# Patient Record
Sex: Female | Born: 1980 | Race: White | Hispanic: No | Marital: Married | Smoking: Never smoker
Health system: Southern US, Community
[De-identification: ages and names within clinical notes are randomized; demographics above are authoritative.]

## PROBLEM LIST (undated history)

## (undated) DIAGNOSIS — K589 Irritable bowel syndrome without diarrhea: Secondary | ICD-10-CM

## (undated) DIAGNOSIS — G43909 Migraine, unspecified, not intractable, without status migrainosus: Secondary | ICD-10-CM

## (undated) HISTORY — DX: Irritable bowel syndrome, unspecified: K58.9

## (undated) HISTORY — PX: OTHER SURGICAL HISTORY: SHX169

## (undated) HISTORY — DX: Migraine, unspecified, not intractable, without status migrainosus: G43.909

---

## 2004-10-19 ENCOUNTER — Ambulatory Visit: Payer: Self-pay | Admitting: Urology

## 2004-10-31 ENCOUNTER — Ambulatory Visit: Payer: Self-pay | Admitting: Urology

## 2005-05-26 ENCOUNTER — Emergency Department: Payer: Self-pay | Admitting: Emergency Medicine

## 2008-02-25 ENCOUNTER — Ambulatory Visit: Payer: Self-pay | Admitting: Internal Medicine

## 2008-04-22 ENCOUNTER — Ambulatory Visit: Payer: Self-pay | Admitting: Unknown Physician Specialty

## 2019-01-20 ENCOUNTER — Other Ambulatory Visit: Payer: Self-pay | Admitting: Unknown Physician Specialty

## 2019-01-20 DIAGNOSIS — R1310 Dysphagia, unspecified: Secondary | ICD-10-CM

## 2019-01-27 ENCOUNTER — Other Ambulatory Visit: Payer: Self-pay | Admitting: Unknown Physician Specialty

## 2019-01-27 ENCOUNTER — Ambulatory Visit
Admission: RE | Admit: 2019-01-27 | Discharge: 2019-01-27 | Disposition: A | Discharge status: Home / Self Care | Payer: Managed Care, Other (non HMO) | Source: Ambulatory Visit | Attending: Unknown Physician Specialty | Admitting: Unknown Physician Specialty

## 2019-01-27 ENCOUNTER — Other Ambulatory Visit: Payer: Self-pay

## 2019-01-27 DIAGNOSIS — R1310 Dysphagia, unspecified: Secondary | ICD-10-CM

## 2019-01-29 ENCOUNTER — Other Ambulatory Visit: Payer: Self-pay | Admitting: Unknown Physician Specialty

## 2019-01-29 DIAGNOSIS — R1011 Right upper quadrant pain: Secondary | ICD-10-CM

## 2019-02-10 ENCOUNTER — Telehealth: Payer: Self-pay | Admitting: Unknown Physician Specialty

## 2019-02-11 ENCOUNTER — Other Ambulatory Visit: Payer: Managed Care, Other (non HMO)

## 2019-02-11 ENCOUNTER — Ambulatory Visit: Payer: Managed Care, Other (non HMO)

## 2019-02-16 ENCOUNTER — Other Ambulatory Visit: Payer: Managed Care, Other (non HMO)

## 2019-02-16 ENCOUNTER — Ambulatory Visit: Payer: Managed Care, Other (non HMO)

## 2019-08-17 DIAGNOSIS — R05 Cough: Secondary | ICD-10-CM | POA: Diagnosis not present

## 2019-08-17 DIAGNOSIS — Z20828 Contact with and (suspected) exposure to other viral communicable diseases: Secondary | ICD-10-CM | POA: Diagnosis not present

## 2019-08-17 DIAGNOSIS — R0981 Nasal congestion: Secondary | ICD-10-CM | POA: Diagnosis not present

## 2019-08-31 DIAGNOSIS — Z0000 Encounter for general adult medical examination without abnormal findings: Secondary | ICD-10-CM | POA: Diagnosis not present

## 2019-09-01 ENCOUNTER — Other Ambulatory Visit: Payer: Self-pay | Admitting: Internal Medicine

## 2019-09-01 DIAGNOSIS — R1031 Right lower quadrant pain: Secondary | ICD-10-CM

## 2019-09-02 ENCOUNTER — Other Ambulatory Visit: Payer: Self-pay

## 2019-09-02 ENCOUNTER — Ambulatory Visit
Admission: RE | Admit: 2019-09-02 | Discharge: 2019-09-02 | Disposition: A | Discharge status: Home / Self Care | Payer: 59 | Source: Ambulatory Visit | Attending: Internal Medicine | Admitting: Internal Medicine

## 2019-09-02 ENCOUNTER — Other Ambulatory Visit: Payer: Self-pay | Admitting: Internal Medicine

## 2019-09-02 DIAGNOSIS — R197 Diarrhea, unspecified: Secondary | ICD-10-CM | POA: Diagnosis not present

## 2019-09-02 DIAGNOSIS — K760 Fatty (change of) liver, not elsewhere classified: Secondary | ICD-10-CM | POA: Diagnosis not present

## 2019-09-02 DIAGNOSIS — K810 Acute cholecystitis: Secondary | ICD-10-CM

## 2019-09-02 DIAGNOSIS — R1031 Right lower quadrant pain: Secondary | ICD-10-CM

## 2019-09-02 MED ORDER — IOHEXOL 300 MG/ML  SOLN
100.0000 mL | Freq: Once | INTRAMUSCULAR | Status: AC | PRN
  Administered 2019-09-02: 100 mL via INTRAVENOUS

## 2019-09-06 DIAGNOSIS — R1084 Generalized abdominal pain: Secondary | ICD-10-CM | POA: Diagnosis not present

## 2019-09-06 DIAGNOSIS — D72829 Elevated white blood cell count, unspecified: Secondary | ICD-10-CM | POA: Diagnosis not present

## 2019-09-07 DIAGNOSIS — R1084 Generalized abdominal pain: Secondary | ICD-10-CM | POA: Diagnosis not present

## 2019-09-16 DIAGNOSIS — R946 Abnormal results of thyroid function studies: Secondary | ICD-10-CM | POA: Diagnosis not present

## 2019-09-16 DIAGNOSIS — R1084 Generalized abdominal pain: Secondary | ICD-10-CM | POA: Diagnosis not present

## 2019-09-16 DIAGNOSIS — R197 Diarrhea, unspecified: Secondary | ICD-10-CM | POA: Diagnosis not present

## 2019-09-16 DIAGNOSIS — G4700 Insomnia, unspecified: Secondary | ICD-10-CM | POA: Diagnosis not present

## 2019-09-17 DIAGNOSIS — R197 Diarrhea, unspecified: Secondary | ICD-10-CM | POA: Diagnosis not present

## 2019-09-17 DIAGNOSIS — R1084 Generalized abdominal pain: Secondary | ICD-10-CM | POA: Diagnosis not present

## 2019-10-14 DIAGNOSIS — G44229 Chronic tension-type headache, not intractable: Secondary | ICD-10-CM | POA: Diagnosis not present

## 2019-10-14 DIAGNOSIS — R634 Abnormal weight loss: Secondary | ICD-10-CM | POA: Diagnosis not present

## 2019-10-14 DIAGNOSIS — K580 Irritable bowel syndrome with diarrhea: Secondary | ICD-10-CM | POA: Diagnosis not present

## 2019-10-27 DIAGNOSIS — R300 Dysuria: Secondary | ICD-10-CM | POA: Diagnosis not present

## 2019-11-05 DIAGNOSIS — R109 Unspecified abdominal pain: Secondary | ICD-10-CM | POA: Diagnosis not present

## 2019-11-05 DIAGNOSIS — K589 Irritable bowel syndrome without diarrhea: Secondary | ICD-10-CM | POA: Diagnosis not present

## 2019-11-05 DIAGNOSIS — R319 Hematuria, unspecified: Secondary | ICD-10-CM | POA: Diagnosis not present

## 2019-11-05 DIAGNOSIS — R1011 Right upper quadrant pain: Secondary | ICD-10-CM | POA: Diagnosis not present

## 2019-11-10 DIAGNOSIS — R1084 Generalized abdominal pain: Secondary | ICD-10-CM | POA: Diagnosis not present

## 2019-11-10 DIAGNOSIS — R69 Illness, unspecified: Secondary | ICD-10-CM | POA: Diagnosis not present

## 2019-11-24 ENCOUNTER — Ambulatory Visit: Payer: 59 | Attending: Internal Medicine

## 2019-11-24 DIAGNOSIS — Z20828 Contact with and (suspected) exposure to other viral communicable diseases: Secondary | ICD-10-CM | POA: Diagnosis not present

## 2019-11-24 DIAGNOSIS — Z20822 Contact with and (suspected) exposure to covid-19: Secondary | ICD-10-CM

## 2019-11-25 LAB — NOVEL CORONAVIRUS, NAA: SARS-CoV-2, NAA: NOT DETECTED

## 2019-12-29 ENCOUNTER — Encounter: Payer: Self-pay | Admitting: Urology

## 2019-12-29 ENCOUNTER — Ambulatory Visit: Payer: 59 | Admitting: Urology

## 2019-12-29 ENCOUNTER — Ambulatory Visit: Payer: Self-pay | Admitting: Urology

## 2019-12-29 ENCOUNTER — Other Ambulatory Visit: Payer: Self-pay

## 2019-12-29 VITALS — BP 126/86 | HR 90 | Ht 68.0 in | Wt 212.0 lb

## 2019-12-29 DIAGNOSIS — N390 Urinary tract infection, site not specified: Secondary | ICD-10-CM

## 2019-12-29 LAB — BLADDER SCAN AMB NON-IMAGING: Scan Result: 55

## 2019-12-30 LAB — MICROSCOPIC EXAMINATION: RBC, Urine: NONE SEEN /hpf (ref 0–2)

## 2019-12-30 LAB — URINALYSIS, COMPLETE
Bilirubin, UA: NEGATIVE
Glucose, UA: NEGATIVE
Leukocytes,UA: NEGATIVE
Nitrite, UA: NEGATIVE
RBC, UA: NEGATIVE
Specific Gravity, UA: 1.025 (ref 1.005–1.030)
Urobilinogen, Ur: 0.2 mg/dL (ref 0.2–1.0)
pH, UA: 5.5 (ref 5.0–7.5)

## 2019-12-30 NOTE — Progress Notes (Signed)
12/29/2019 8:02 AM   [name] [birth year] [id number]  Referring provider: Danella Penton, MD [phone] [facility] [address] [facility] [place],  [place] [address]  Chief Complaint  Patient presents with  . Recurrent UTI    HPI: Latasha Graham is a 39 y.o. female seen in consultation at request of Dr. Hyacinth Meeker for evaluation of recurrent UTIs.  She complains recurrent UTI, yeast infections and kidney pain.  She had a urinalysis and early December 2020 which showed pyuria and was associated with symptoms of vaginal itching, dysuria, urinary frequency and malodorous urine.  Urine was positive for E. coli and she was treated with a course of cefuroxime with improvement in her symptoms.  She was seen at [facility] on 11/05/2019 with complaints of right upper quadrant pain which has been intermittent since October 2020.  She CT of the abdomen/pelvis with contrast and right upper quadrant ultrasound which showed no significant abnormalities.  CT did not show genitourinary abnormalities.  Follow-up urinalysis and culture were negative.  She was hospitalized in [place] several years ago for pyelonephritis which initially presented with nausea, vomiting, fatigue and flulike symptoms.  She states her case was severe as there was a delay in diagnosis.  She has a history of stone disease with previous ureteroscopic removal/stent x2.  Her last stone episode was around 1996/1997.  She has intermittent urinary symptoms of dysuria, frequency, intermittent stream and urinary hesitancy.  No relation of her symptoms to intercourse.  She estimates the symptoms occurring 4 times in the last 6 months.  She has bilateral mid back pain.  She has had 3 cultures since March 2019 one being positive however states she often treats with hydration and supplements as she does not like to take antibiotics unless absolutely necessary.  She also complains of recurrent yeast infections.  She denies  gross hematuria and on occasion notes a small amount of blood on tissue paper when wiping however is not sure if this is vaginal or rectal.  Her urinalysis have not shown microhematuria.   PMH: No past medical history on file.  Surgical History: Procedure Laterality Date  . CERVICAL BIOPSY W/ LOOP ELECTRODE EXCISION  . COLONOSCOPY 01/06/2019  Adenomatous Polyp: CBF 12/2023  . EGD 01/07/2019  Gastritis: No repeat per RTE  . EGD 02/09/2019  Gastritis: Minimal chronic: No repeat per RTE  . Kidney stone removal 1994    Home Medications:  Allergies as of 12/29/2019      Reactions   Levaquin [levofloxacin] Other (See Comments)   Pt states it makes her Lethargic      Medication List       Accurate as of December 29, 2019 11:59 PM. If you have any questions, ask your nurse or doctor.        ALPRAZolam 0.25 MG tablet Commonly known as: XANAX Take 0.25 mg by mouth 2 (two) times daily.   azelastine 0.1 % nasal spray Commonly known as: ASTELIN Place into the nose.   chlorpheniramine-HYDROcodone 10-8 MG/5ML Suer Commonly known as: TUSSIONEX Take by mouth.   clonazePAM 0.5 MG tablet Commonly known as: KLONOPIN TAKE 1 TABLET (0.5 MG TOTAL) BY MOUTH 2 (TWO) TIMES DAILY FOR 30 DAYS   cyanocobalamin 1000 MCG tablet Take by mouth.   dicyclomine 20 MG tablet Commonly known as: BENTYL Take by mouth.   eletriptan 40 MG tablet Commonly known as: RELPAX TAKE 1 TAB DAILY AS NEEDED FOR HEADACHE MAY TAKE A 2ND DOSE AFTER 2 HR IF  NEEDED   imipramine 10 MG tablet Commonly known as: TOFRANIL Take 10 mg by mouth at bedtime.   omeprazole 40 MG capsule Commonly known as: PRILOSEC Take 40 mg by mouth daily.   ondansetron 8 MG tablet Commonly known as: ZOFRAN Take 8 mg by mouth 2 (two) times daily.   pramipexole 0.25 MG tablet Commonly known as: MIRAPEX TAKE 1 TABLET BY MOUTH NIGHTLY   topiramate 25 MG tablet Commonly known as: TOPAMAX Take 100 mg by mouth at bedtime.     venlafaxine XR 37.5 MG 24 hr capsule Commonly known as: EFFEXOR-XR Take 37.5 mg by mouth daily.       Allergies:  Allergies  Allergen Reactions  . Levaquin [Levofloxacin] Other (See Comments)    Pt states it makes her Lethargic     Family History: No family history on file.  Social History:  reports that she has never smoked. She has never used smokeless tobacco. She reports that she does not drink alcohol or use drugs.  ROS: UROLOGY Frequent Urination?: No Hard to postpone urination?: No Burning/pain with urination?: Yes Get up at night to urinate?: Yes Leakage of urine?: Yes Urine stream starts and stops?: Yes Trouble starting stream?: No Do you have to strain to urinate?: Yes Blood in urine?: No Urinary tract infection?: Yes Sexually transmitted disease?: No Injury to kidneys or bladder?: No Painful intercourse?: No Weak stream?: Yes Currently pregnant?: No Vaginal bleeding?: No Last menstrual period?: n  Gastrointestinal Nausea?: Yes Vomiting?: No Indigestion/heartburn?: Yes Diarrhea?: Yes Constipation?: Yes  Constitutional Fever: No Night sweats?: No Weight loss?: Yes Fatigue?: Yes  Skin Skin rash/lesions?: No Itching?: No  Eyes Blurred vision?: No Double vision?: No  Ears/Nose/Throat Sore throat?: No Sinus problems?: Yes  Hematologic/Lymphatic Swollen glands?: No Easy bruising?: Yes  Cardiovascular Leg swelling?: No Chest pain?: No  Respiratory Cough?: Yes Shortness of breath?: No  Endocrine Excessive thirst?: No  Musculoskeletal Back pain?: Yes Joint pain?: Yes  Neurological Headaches?: Yes Dizziness?: Yes  Psychologic Depression?: No Anxiety?: Yes  Physical Exam: BP 126/86   Pulse 90   Ht 5\' 8"  (1.727 m)   Wt 212 lb (96.2 kg)   BMI 32.23 kg/m   Constitutional:  Alert and oriented, No acute distress. HEENT: [place] AT, moist mucus membranes.  Trachea midline, no masses. Cardiovascular: No clubbing, cyanosis, or  edema. Respiratory: Normal respiratory effort, no increased work of breathing. GI: Abdomen is soft, nontender, nondistended, no abdominal masses GU: No CVA tenderness Skin: No rashes, bruises or suspicious lesions. Neurologic: Grossly intact, no focal deficits, moving all 4 extremities. Psychiatric: Normal mood and affect.  Laboratory Data: Urinalysis Dipstick/microscopy negative  Pertinent Imaging: CT 08/2019 personally reviewed  Assessment & Plan:   - 39 y.o. female with recurrent lower urinary tract symptoms including dysuria.  1 documented positive urine culture.  We discussed preventative treatment with supplements including cranberry tablets and D-mannose.  We will have her follow-up in approximately 4-6 weeks for recheck of symptoms.  Discussed the role of cystoscopy and evaluation of recurrent UTI and voiding symptoms.  She has not had microhematuria.  We will discuss further on follow-up.  -Recent CT showed no urinary calculi or changes suspicious for pyelonephritis  -I did recommend a gynecology evaluation for her recurrent yeast infections.   Abbie Sons, [place] [address], [place], [place] [address] [phone]

## 2020-01-02 ENCOUNTER — Encounter: Payer: Self-pay | Admitting: Urology

## 2020-01-27 ENCOUNTER — Ambulatory Visit: Payer: Self-pay | Admitting: Urology

## 2020-08-02 IMAGING — CT CT ABD-PELV W/ CM
2 of 4 series · 16 of 46 positions shown, 18 images · IV contrast (omnipaque)
Comparison: Noncontrast CT on 02/25/2008 from [HOSPITAL]

CLINICAL DATA: Right lower quadrant pain, fever, and diarrhea for 5
days.

EXAM:
CT ABDOMEN AND PELVIS WITH CONTRAST
TECHNIQUE: Multidetector CT imaging of the abdomen and pelvis was performed
using the standard protocol following bolus administration of
intravenous contrast.
CONTRAST:  100mL OMNIPAQUE IOHEXOL 300 MG/ML  SOLN

[Series 2: abd pelvis · axial · 0.75mm/px · z∈[-1692,-1217]mm · 13 of 105 slices shown, 15 images]
[im 5/105  soft-tissue]
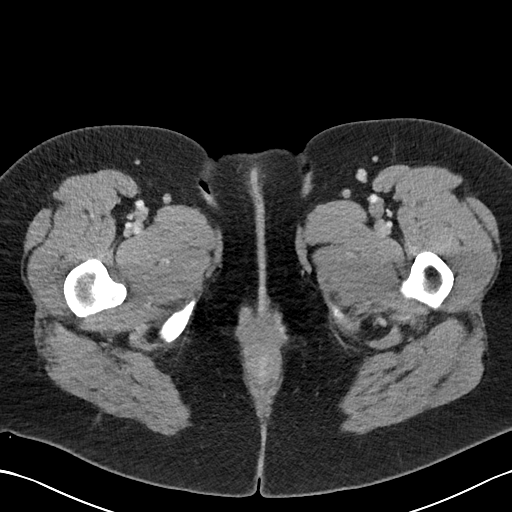
[im 5/105  bone]
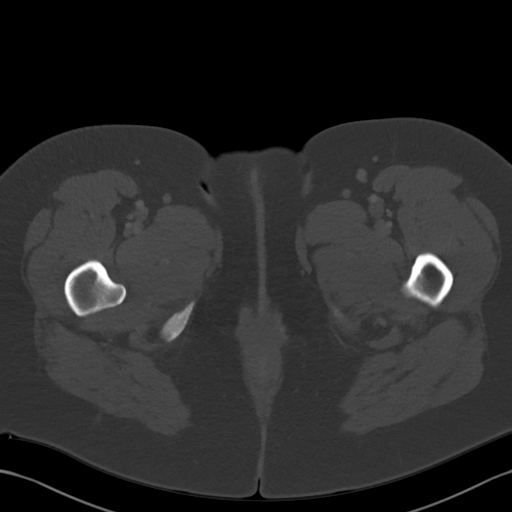
[im 14/105  soft-tissue]
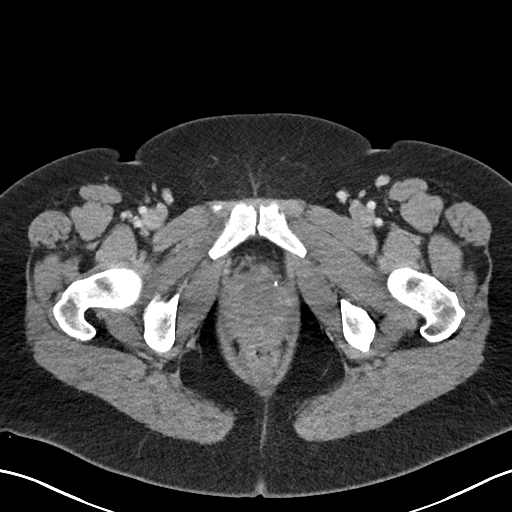
[im 23/105  soft-tissue]
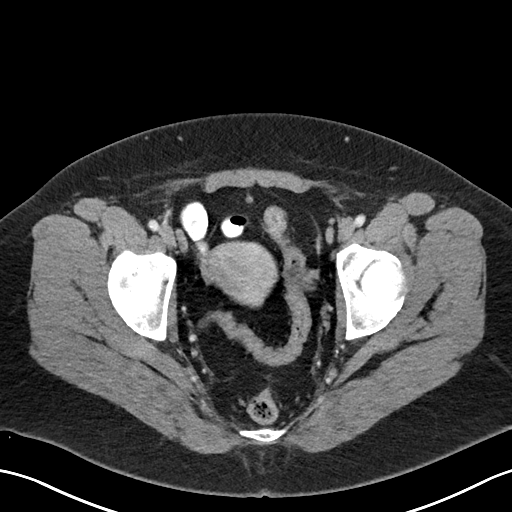
[im 28/105  soft-tissue]
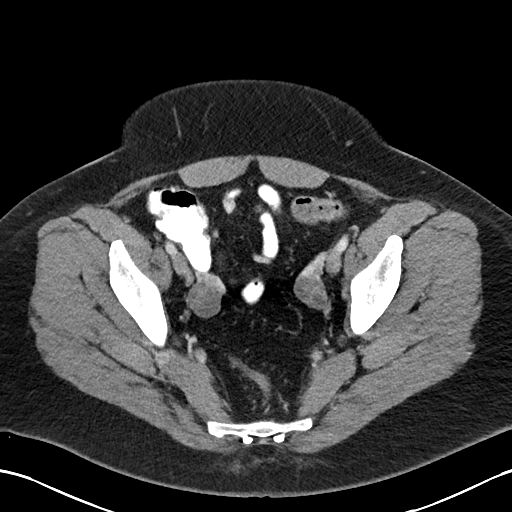
[im 37/105  soft-tissue]
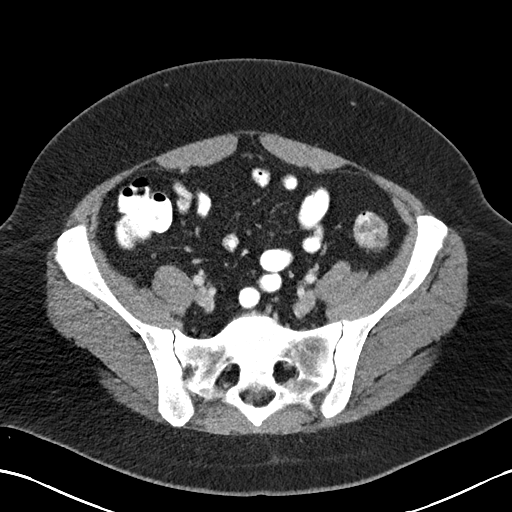
[im 46/105  soft-tissue]
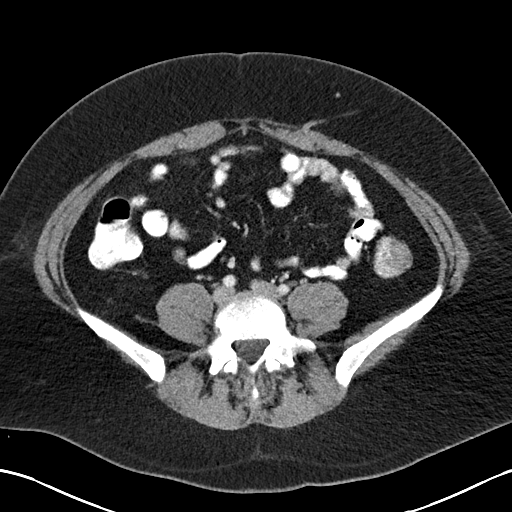
[im 55/105  soft-tissue]
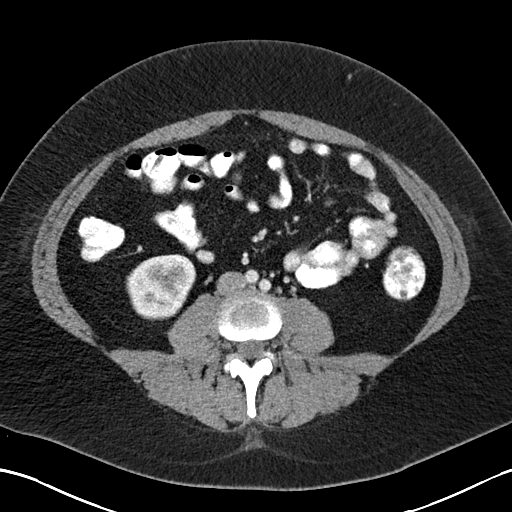
[im 59/105  soft-tissue]
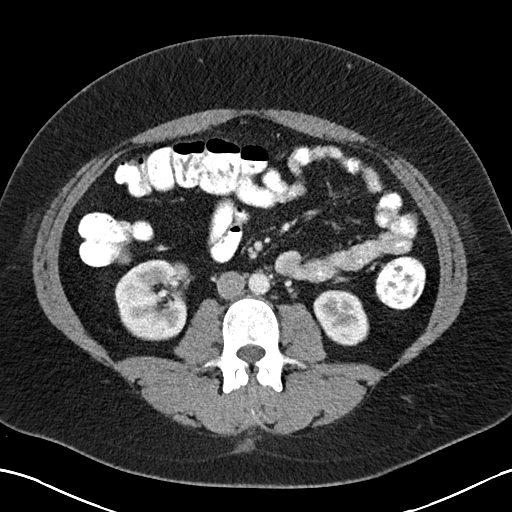
[im 68/105  soft-tissue]
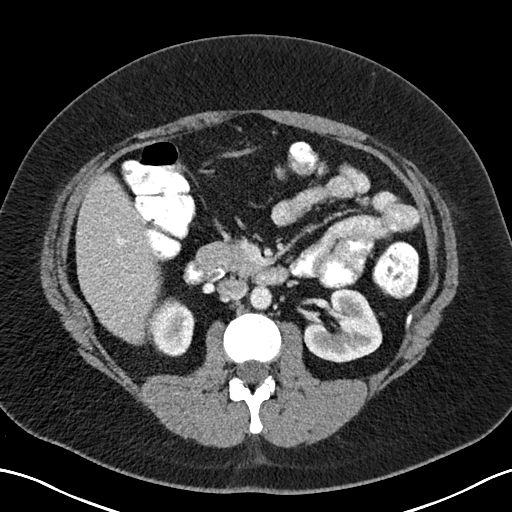
[im 68/105  bone]
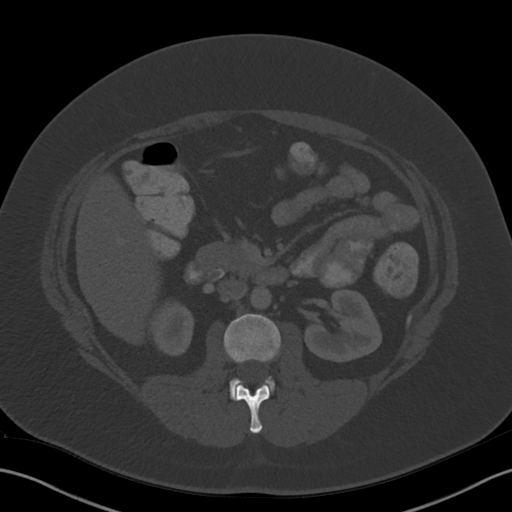
[im 77/105  soft-tissue]
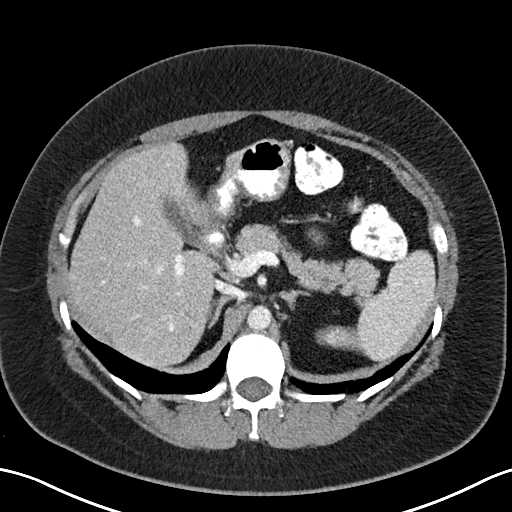
[im 82/105  soft-tissue]
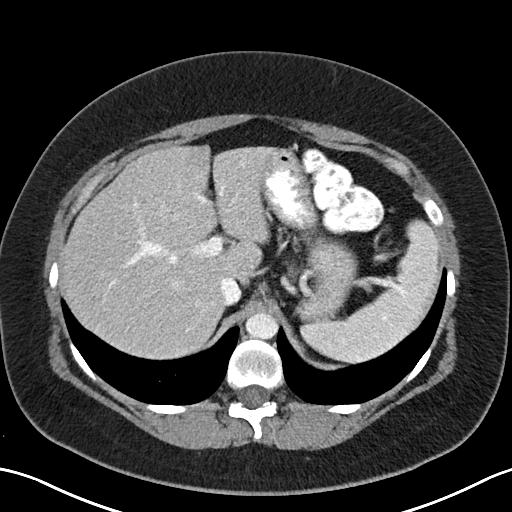
[im 91/105  soft-tissue]
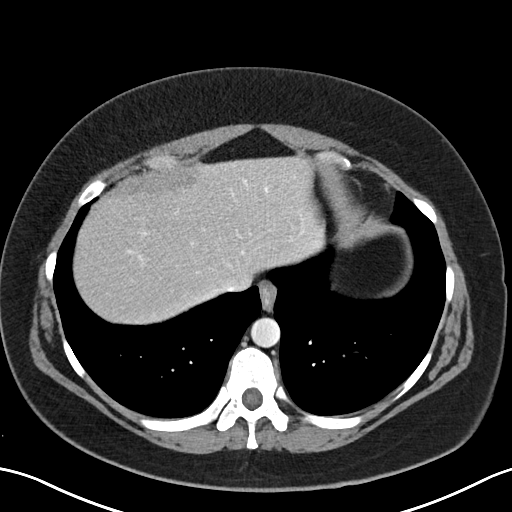
[im 100/105  soft-tissue]
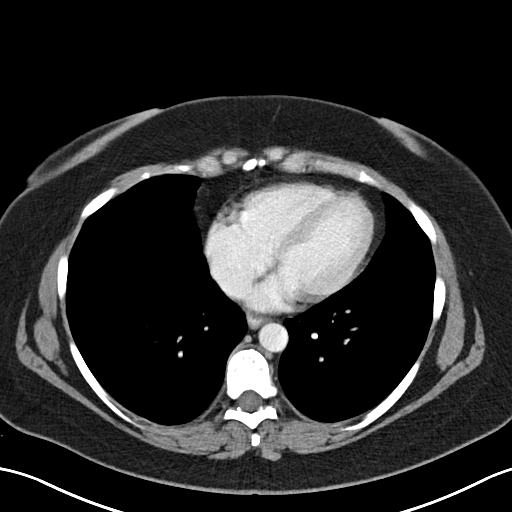

[Series 4: coronals abd pelvis · coronal · 0.75mm/px · 3 of 176 slices shown]
[im 59/176  soft-tissue]
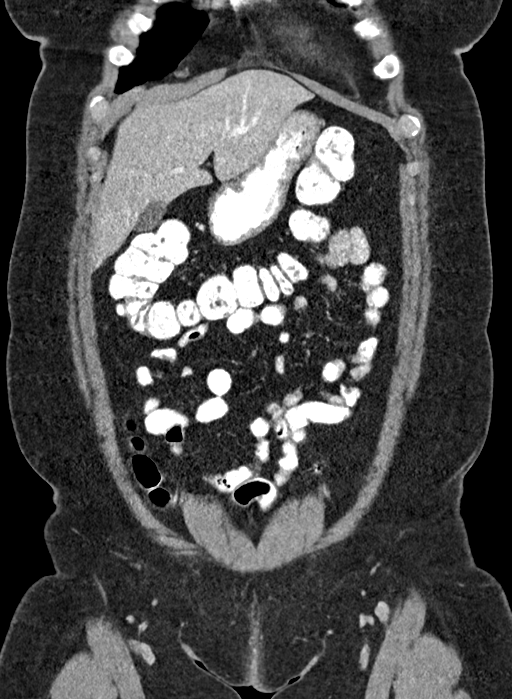
[im 78/176  soft-tissue]
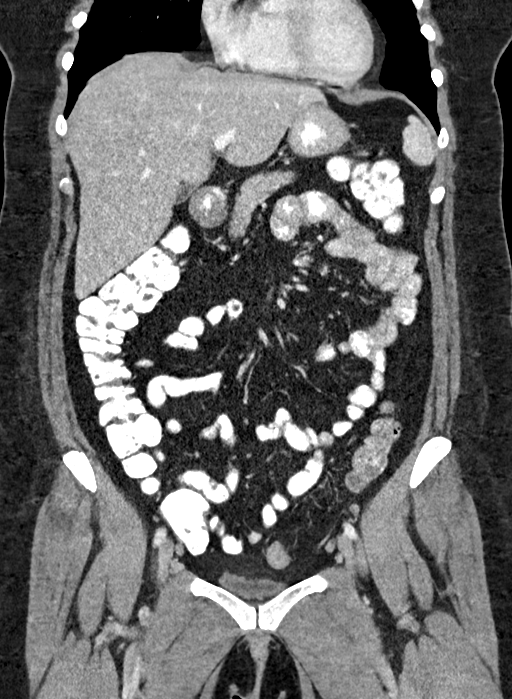
[im 98/176  soft-tissue]
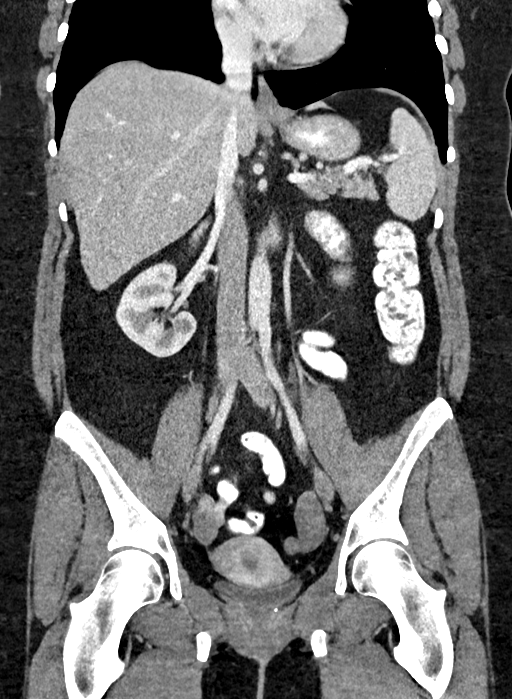

[16 of 46 positions shown; findings below may reference images not displayed]

FINDINGS: Lower Chest: No acute findings.

Hepatobiliary: No hepatic masses identified. Gallbladder is
unremarkable. No evidence of biliary ductal dilatation.

Pancreas:  No mass or inflammatory changes.

Spleen: Within normal limits in size and appearance.

Adrenals/Urinary Tract: No masses identified. Tiny right renal cyst
noted. No evidence of hydronephrosis.

Stomach/Bowel: No evidence of obstruction, inflammatory process or
abnormal fluid collections. Normal appendix visualized.

Vascular/Lymphatic: No pathologically enlarged lymph nodes. No
abdominal aortic aneurysm.

Reproductive:  No mass or other significant abnormality.

Other:  No evidence of inguinal hernia or mass.

Musculoskeletal:  No suspicious bone lesions identified.
IMPRESSION: Negative. No evidence of appendicitis or other significant
abnormality.

## 2020-08-02 IMAGING — US US ABDOMEN LIMITED
1 series · 14 of 25 positions shown · non-contrast
Comparison: None.

CLINICAL DATA: Patient with right-sided abdominal pain.

EXAM:
ULTRASOUND ABDOMEN LIMITED RIGHT UPPER QUADRANT

[Series 1: us abdomen limited · 0.26mm/px · 14 of 44 slices shown]
[im 1/44]
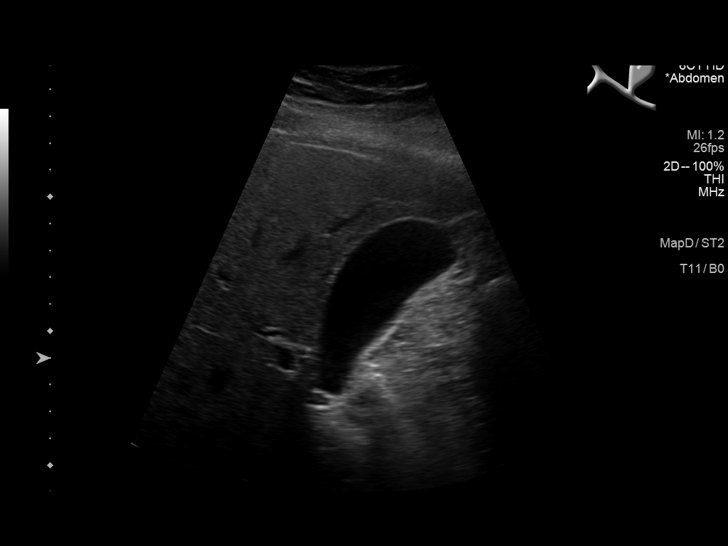
[im 4/44]
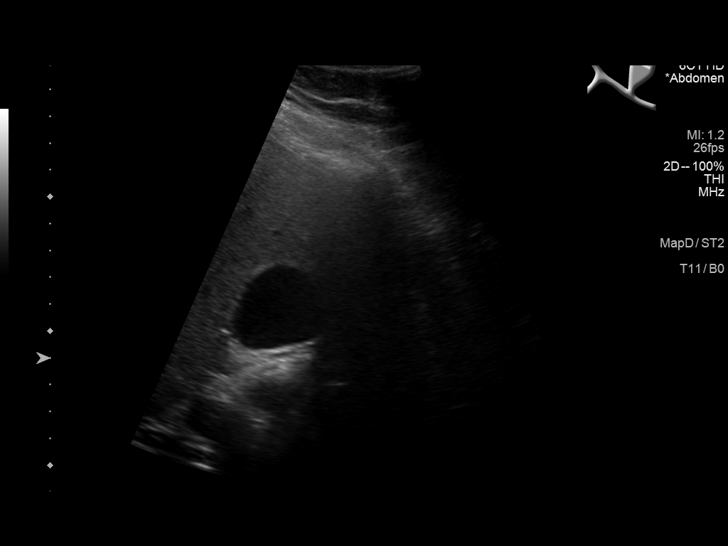
[im 8/44]
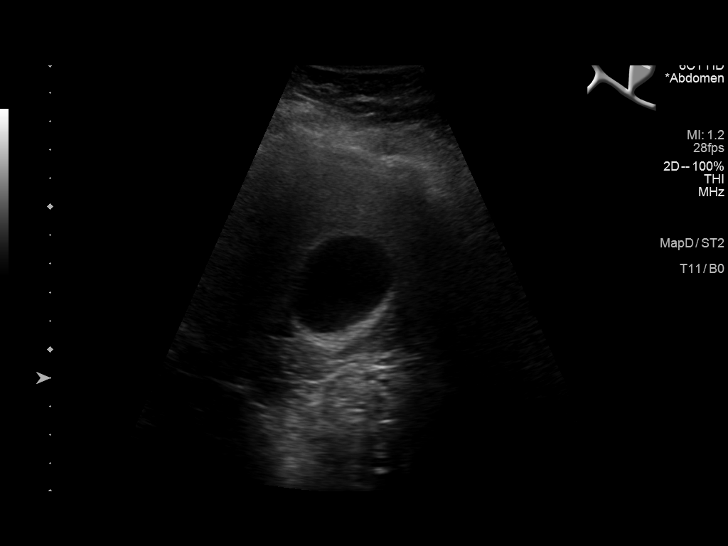
[im 11/44]
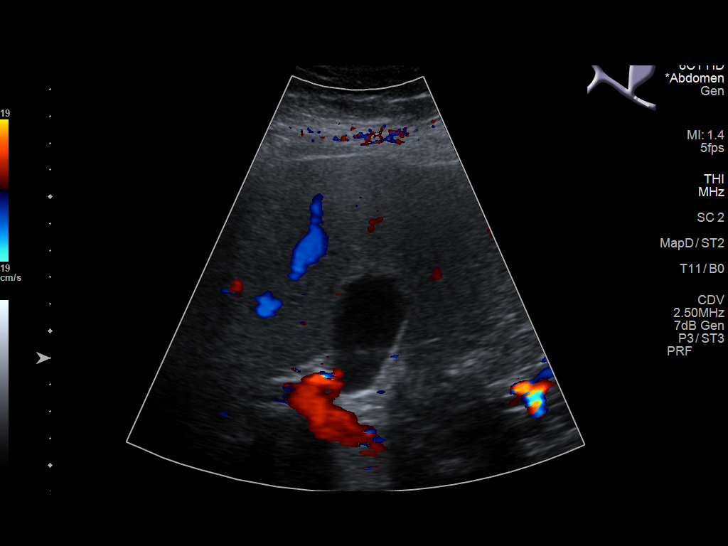
[im 15/44]
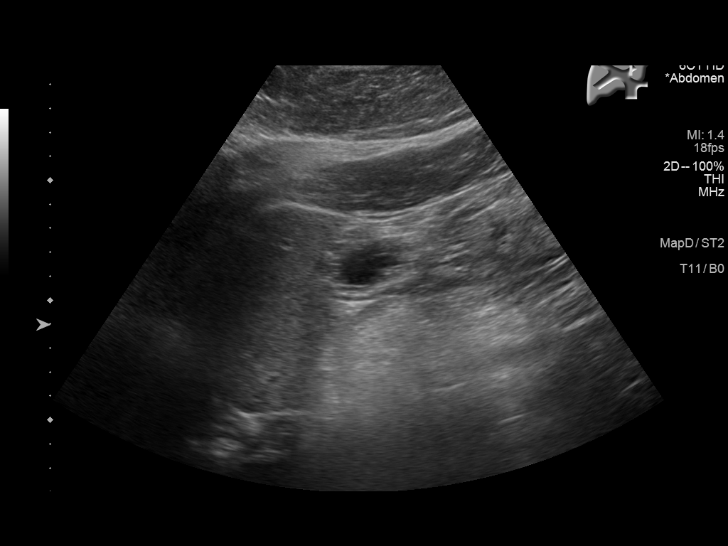
[im 17/44]
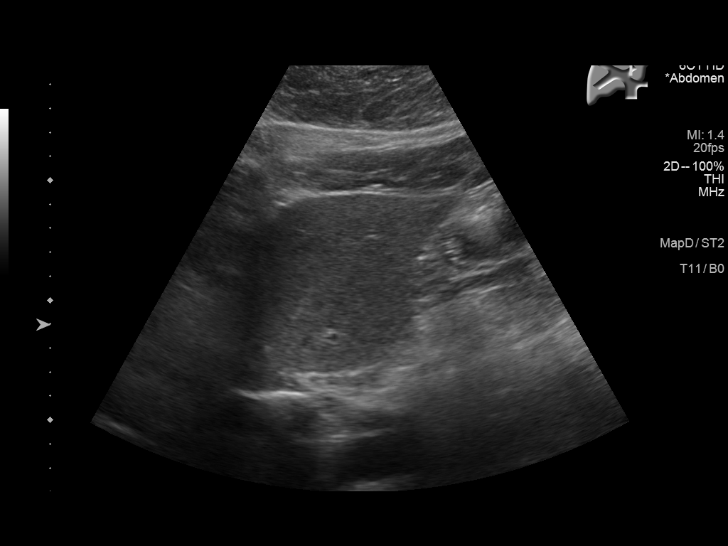
[im 20/44]
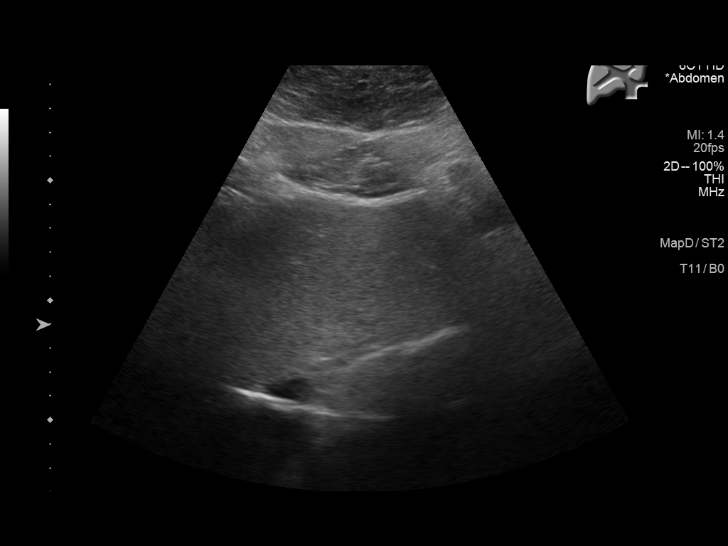
[im 24/44]
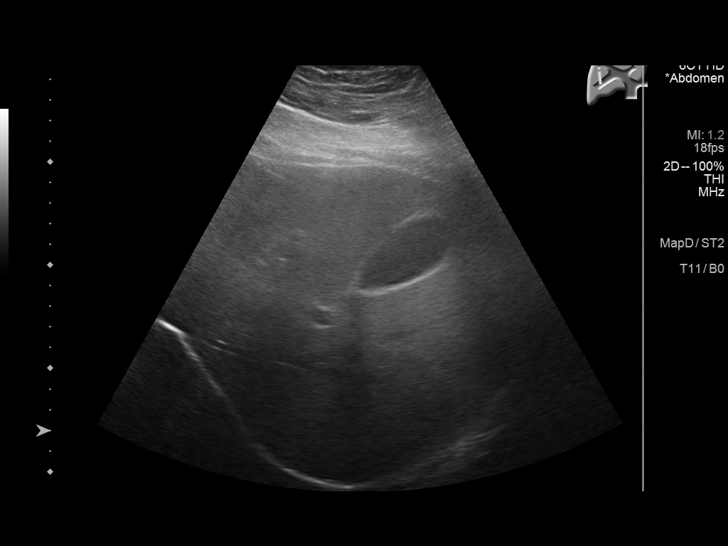
[im 27/44]
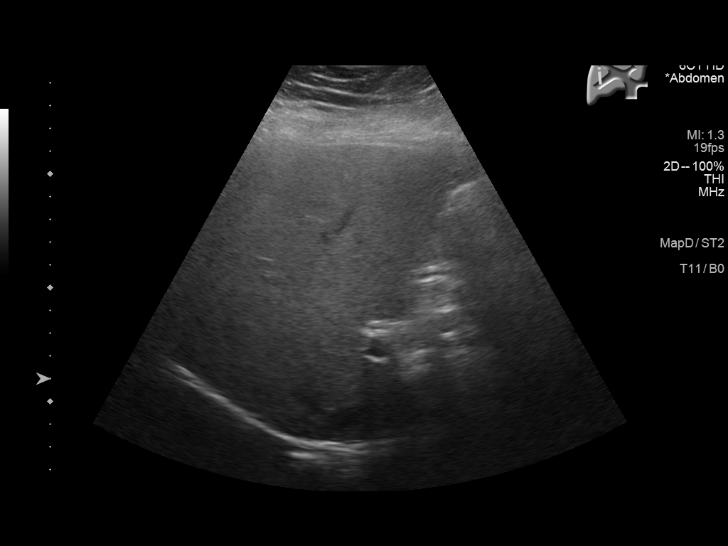
[im 29/44]
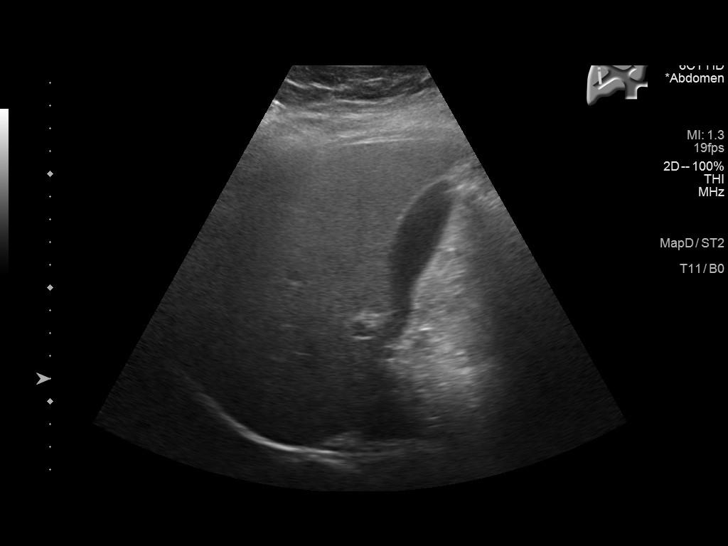
[im 33/44]
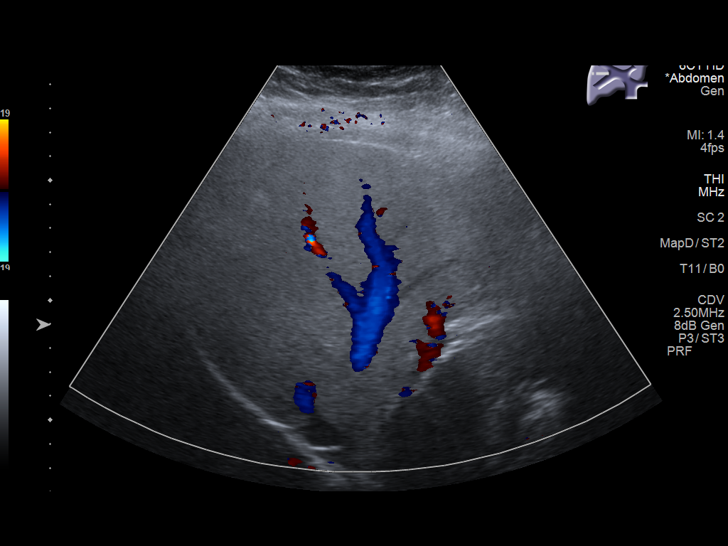
[im 36/44]
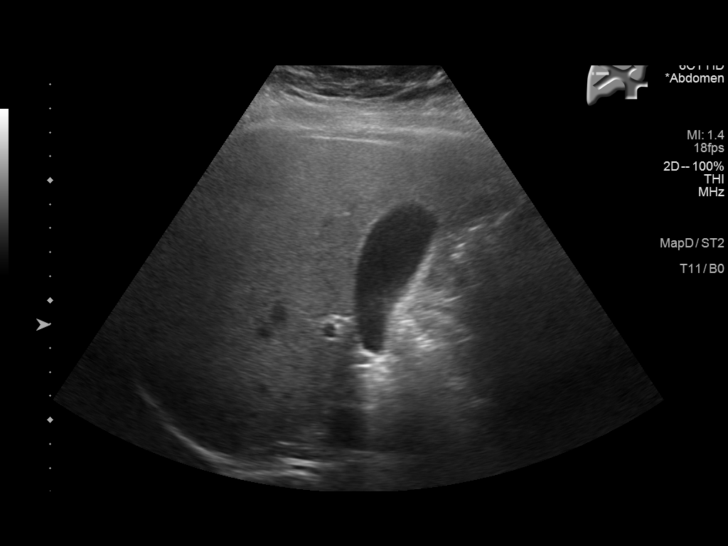
[im 40/44]
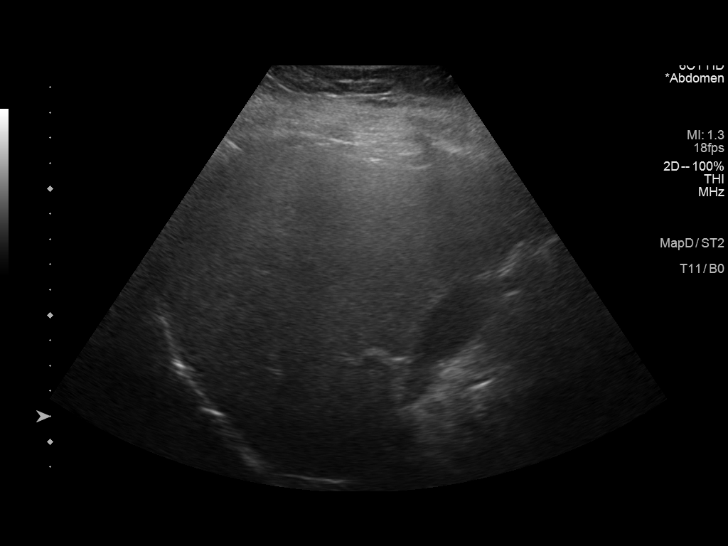
[im 44/44]
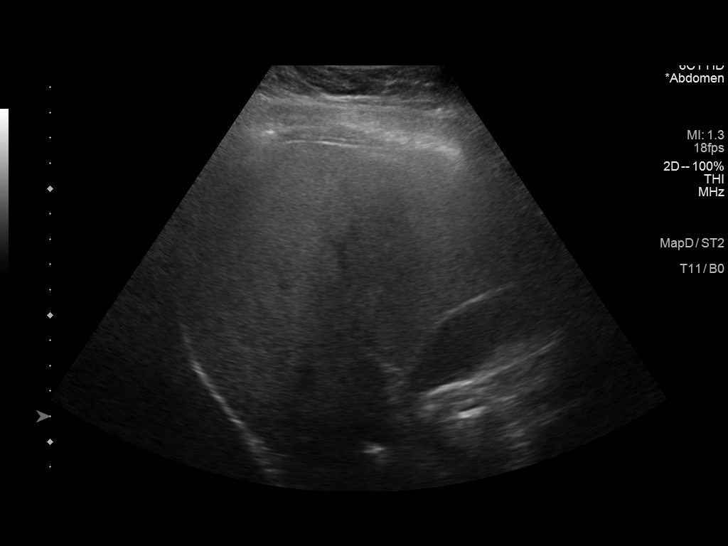

[14 of 25 positions shown; findings below may reference images not displayed]

FINDINGS: Gallbladder:

No gallstones or wall thickening visualized. No sonographic Murphy
sign noted by sonographer.

Common bile duct:

Diameter: 3 mm

Liver:

Increased in echogenicity. No focal hepatic lesions identified.
Portal vein is patent on color Doppler imaging with normal direction
of blood flow towards the liver.

Other: None.
IMPRESSION: No cholelithiasis or sonographic evidence for acute cholecystitis.

Hepatic steatosis.

## 2021-06-28 ENCOUNTER — Ambulatory Visit: Payer: Self-pay | Admitting: Obstetrics and Gynecology

## 2021-07-26 ENCOUNTER — Ambulatory Visit: Payer: Self-pay | Admitting: Obstetrics and Gynecology

## 2021-08-23 ENCOUNTER — Ambulatory Visit: Payer: Self-pay | Admitting: Obstetrics and Gynecology

## 2021-09-13 ENCOUNTER — Ambulatory Visit: Payer: Self-pay | Admitting: Obstetrics and Gynecology

## 2021-09-25 ENCOUNTER — Ambulatory Visit: Payer: Self-pay | Admitting: Obstetrics and Gynecology

## 2021-10-05 ENCOUNTER — Ambulatory Visit (INDEPENDENT_AMBULATORY_CARE_PROVIDER_SITE_OTHER): Payer: Self-pay | Admitting: Obstetrics and Gynecology

## 2021-10-05 ENCOUNTER — Encounter: Payer: Self-pay | Admitting: Obstetrics and Gynecology

## 2021-10-05 ENCOUNTER — Other Ambulatory Visit (HOSPITAL_COMMUNITY)
Admission: RE | Admit: 2021-10-05 | Discharge: 2021-10-05 | Disposition: A | Discharge status: Home / Self Care | Payer: Commercial Managed Care - PPO | Source: Ambulatory Visit | Attending: Obstetrics and Gynecology | Admitting: Obstetrics and Gynecology

## 2021-10-05 ENCOUNTER — Other Ambulatory Visit: Payer: Self-pay

## 2021-10-05 VITALS — BP 122/70 | Ht 68.0 in | Wt 219.0 lb

## 2021-10-05 DIAGNOSIS — Z124 Encounter for screening for malignant neoplasm of cervix: Secondary | ICD-10-CM

## 2021-10-05 DIAGNOSIS — N39498 Other specified urinary incontinence: Secondary | ICD-10-CM

## 2021-10-05 DIAGNOSIS — K649 Unspecified hemorrhoids: Secondary | ICD-10-CM

## 2021-10-05 DIAGNOSIS — Z1331 Encounter for screening for depression: Secondary | ICD-10-CM

## 2021-10-05 DIAGNOSIS — Z01419 Encounter for gynecological examination (general) (routine) without abnormal findings: Secondary | ICD-10-CM | POA: Insufficient documentation

## 2021-10-05 DIAGNOSIS — Z1339 Encounter for screening examination for other mental health and behavioral disorders: Secondary | ICD-10-CM

## 2021-10-05 DIAGNOSIS — N390 Urinary tract infection, site not specified: Secondary | ICD-10-CM

## 2021-10-05 NOTE — Progress Notes (Signed)
Gynecology Annual Exam  PCP: Danella Penton, MD  Chief Complaint  Patient presents with   Gynecologic Exam    Hemorrhoids, spotting between cycles (last 4 months), leaks urine when coughs   History of Present Illness:  [name] is a 40 y.o. G0P0000 who LMP was Patient's last menstrual period was 09/21/2021 (approximate)., presents today for her annual examination.  Her menses are regular every 28-30 days, lasting 7 day(s).  Dysmenorrhea severe, occurring premenstrually and first 1-2 days of flow. She does have intermenstrual bleeding.  She also has spotting that sometimes just stops and sometimes leads into her period, though this can be not on time for her period.  She feels like this mostly occurs closer to the time of her period.  She believes that this has been going on for the past 3-4 months.  She denies bleeding after intercourse.  With her discharge, she believes she has an odor.  This has been going on for about 6 months. This occurs all the time.  She notes some itching associated with this.    She does not have vasomotor sx. She states that she stays pretty warm all the time. She doesn't think she has hot flashes.   She is sexually active. She does not have vaginal dryness. If anything, she would say she has excessive discharge and maybe an odor.  She was on NuvaRing for a while and she stopped this when she found out her husband could not get her pregnant.   Last Pap: ?2 years ago  Results were: no abnormalities /neg HPV DNA.  Hx of STDs: none History of LEEP procedure.  This was back in college.   Last mammogram: never had.  There is a  FH of breast cancer. There is a FH of ovarian cancer. The patient does not do self-breast exams.  Colonoscopy: has had. She has IBS.  She has seen Dr. Markham Jordan.   DEXA: has not been screened for osteoporosis  Tobacco use:  former smoker. Alcohol use: social drinker Exercise: walks her dog.  This is like walking/hiking  The patient  wears seatbelts: yes.     She reports leaking urine and a current UTI.  Sometimes the leaking of urine can happen with stress (laughing, coughing, etc), other times she just leaks urine. In her career she would hold her urine for a while.    She also has a history of pyelonephritis. She has been hospitalized for this (back in 2001).  She has a distant history of kidney stones.  She feels like when she gets stressed out she has manifestations in her kidneys. Currently, her right kidney is bothering her. She is on day 4 of amoxicillin.  She was on cefatin prior to this.  She was told to stop this medication due to the fact that it didn't seem to be working.    She also believes that she is getting a yeast infection due to taking antibiotics.   She had COVID back in August. She is taking some long-term medications which make it hard for her to focus.   She has a hemorrhoid hat she would like to have assessed.  Perhaps by GI vs surgery.    Past Medical History:  Diagnosis Date   IBS (irritable bowel syndrome)    Migraines    Past Surgical History:  Procedure Laterality Date   OTHER SURGICAL HISTORY     Kidney Stone removed    Prior to Admission medications  Medication Sig Start Date End Date Taking? Authorizing Provider  ALPRAZolam (XANAX) 0.25 MG tablet Take 0.25 mg by mouth 2 (two) times daily. 11/15/19  Yes [provider]  amoxicillin-clavulanate (AUGMENTIN) 500-125 MG tablet Take 1 tablet by mouth 3 (three) times daily. 10/01/21  Yes [provider]  amphetamine-dextroamphetamine (ADDERALL) 10 MG tablet Take 10 mg by mouth 2 (two) times daily. 09/17/21  Yes [provider]  B-D INTEGRA SYRINGE 22G X 1-1/2" 3 ML MISC 3 CC SYRINGE WITH 25 GAUGE 1 [facility] NEEDLE 04/20/21  Yes [provider]  cyanocobalamin (,VITAMIN B-12,) 1000 MCG/ML injection Inject into the muscle. 08/02/21 10/31/21 Yes [provider]  Cyanocobalamin (VITAMIN B-12 PO) Take by  mouth.   Yes [provider]  cyclobenzaprine (FLEXERIL) 10 MG tablet TAKE 1 TABLET BY MOUTH TWICE A DAY AS NEEDED FOR MUSCLE SPASMS FOR UP TO 10 DAYS 03/08/21  Yes [provider]  eletriptan (RELPAX) 40 MG tablet TAKE 1 TAB DAILY AS NEEDED FOR HEADACHE MAY TAKE A 2ND DOSE AFTER 2 HR IF NEEDED 12/21/19  Yes [provider]  famotidine (PEPCID) 40 MG tablet Take 40 mg by mouth at bedtime. 09/20/21  Yes [provider]  omeprazole (PRILOSEC) 40 MG capsule Take 40 mg by mouth daily. 12/19/19  Yes [provider]  ondansetron (ZOFRAN) 8 MG tablet Take 8 mg by mouth 2 (two) times daily. 12/07/19  Yes [provider]  pramipexole (MIRAPEX) 0.5 MG tablet Take 0.5 mg by mouth 2 (two) times daily. 09/21/21  Yes [provider]  propranolol (INDERAL) 40 MG tablet Take 40 mg by mouth 2 (two) times daily. 10/03/21  Yes [provider]  temazepam (RESTORIL) 30 MG capsule Take 30 mg by mouth at bedtime as needed. 08/01/21  Yes [provider]  topiramate (TOPAMAX) 100 MG tablet Take 100 mg by mouth at bedtime. 10/01/21  Yes [provider]  traZODone (DESYREL) 50 MG tablet Take 50 mg by mouth at bedtime. 09/21/21  Yes [provider]  triamcinolone (KENALOG) 0.025 % ointment Apply 1 application topically 2 (two) times daily. 08/30/21  Yes [provider]  venlafaxine XR (EFFEXOR-XR) 75 MG 24 hr capsule Take 75 mg by mouth daily. 05/21/21  Yes [provider]    Allergies  Allergen Reactions   Levaquin [Levofloxacin] Other (See Comments)    Pt states it makes her Lethargic     Obstetric History: G0P0000  Family History  Problem Relation Age of Onset   Ovarian cancer Maternal Aunt 34   Cancer Maternal Aunt        Mouth    Social History   Socioeconomic History   Marital status: Married    Spouse name: Not on file   Number of children: Not on file   Years of education: Not on file    Highest education level: Not on file  Occupational History   Not on file  Tobacco Use   Smoking status: Never   Smokeless tobacco: Never  Vaping Use   Vaping Use: Never used  Substance and Sexual Activity   Alcohol use: Never   Drug use: Never   Sexual activity: Yes    Birth control/protection: None  Other Topics Concern   Not on file  Social History Narrative   Not on file   Social Determinants of Health   Financial Resource Strain: Not on file  Food Insecurity: Not on file  Transportation Needs: Not on file  Physical Activity: Not on  file  Stress: Not on file  Social Connections: Not on file  Intimate Partner Violence: Not on file   Review of Systems  Constitutional: Negative.   HENT: Negative.    Eyes: Negative.   Respiratory: Negative.    Cardiovascular: Negative.   Gastrointestinal: Negative.   Genitourinary: Negative.   Musculoskeletal: Negative.   Skin: Negative.   Neurological: Negative.   Psychiatric/Behavioral: Negative.      Physical Exam BP 122/70   Ht 5\' 8"  (1.727 m)   Wt 219 lb (99.3 kg)   LMP 09/21/2021 (Approximate)   BMI 33.30 kg/m   Physical Exam Constitutional:      General: She is not in acute distress.    Appearance: Normal appearance. She is well-developed.  Genitourinary:     Vulva and bladder normal.     Right Labia: No rash, tenderness, lesions, skin changes or Bartholin's cyst.    Left Labia: No tenderness, lesions, skin changes, Bartholin's cyst or rash.    No inguinal adenopathy present in the right or left side.    Pelvic Tanner Score: 5/5.    No vaginal discharge, erythema, tenderness or bleeding.      Right Adnexa: not tender, not full and no mass present.    Left Adnexa: not tender, not full and no mass present.    No cervical motion tenderness, discharge, lesion or polyp.     Uterus is not enlarged or tender.     No uterine mass detected.    Pelvic exam was performed with patient in the lithotomy position.  Breasts:     Right: No inverted nipple, mass, nipple discharge, skin change or tenderness.     Left: No inverted nipple, mass, nipple discharge, skin change or tenderness.  HENT:     Head: Normocephalic and atraumatic.  Eyes:     General: No scleral icterus.    Conjunctiva/sclera: Conjunctivae normal.  Neck:     Thyroid: No thyromegaly.  Cardiovascular:     Rate and Rhythm: Normal rate and regular rhythm.     Heart sounds: No murmur heard.   No friction rub. No gallop.  Pulmonary:     Effort: Pulmonary effort is normal. No respiratory distress.     Breath sounds: Normal breath sounds. No wheezing or rales.  Abdominal:     General: Bowel sounds are normal. There is no distension.     Palpations: Abdomen is soft. There is no mass.     Tenderness: There is no abdominal tenderness. There is no guarding or rebound.     Hernia: There is no hernia in the left inguinal area or right inguinal area.  Musculoskeletal:        General: No swelling or tenderness. Normal range of motion.     Cervical back: Normal range of motion and neck supple.  Lymphadenopathy:     Cervical: No cervical adenopathy.     Lower Body: No right inguinal adenopathy. No left inguinal adenopathy.  Neurological:     General: No focal deficit present.     Mental Status: She is alert and oriented to person, place, and time.     Cranial Nerves: No cranial nerve deficit.  Skin:    General: Skin is warm and dry.     Findings: No erythema or rash.  Psychiatric:        Mood and Affect: Mood normal.        Behavior: Behavior normal.        Judgment: Judgment normal.  Female chaperone present for pelvic and breast  portions of the physical exam  Results: AUDIT Questionnaire (screen for alcoholism): 3 PHQ-9: 8  Assessment: 40 y.o. G0P0000 female here for routine gynecologic examination.  Plan: Problem List Items Addressed This Visit   None Visit Diagnoses     Women's annual routine gynecological examination    -  Primary    Relevant Orders   Cytology - PAP   Screening for depression       Screening for alcoholism       Pap smear for cervical cancer screening       Relevant Orders   Cytology - PAP   Other urinary incontinence       Relevant Orders   Ambulatory referral to Urogynecology   Hemorrhoids, unspecified hemorrhoid type       Relevant Medications   propranolol (INDERAL) 40 MG tablet   Other Relevant Orders   Ambulatory referral to Gastroenterology   Recurrent UTI       Relevant Orders   Ambulatory referral to Urogynecology       Screening: -- Blood pressure screen normal -- Colonoscopy -  per GI -- Mammogram - due. Patient to call [facility] to arrange. She understands that it is her responsibility to arrange this. -- Weight screening: obese: discussed management options, including lifestyle, dietary, and exercise. -- Depression screening negative (PHQ-9) -- Nutrition: normal -- cholesterol screening: not due for screening -- osteoporosis screening: not due -- tobacco screening: not using -- alcohol screening: AUDIT questionnaire indicates low-risk usage. -- family history of breast cancer screening: done. not at high risk. -- no evidence of domestic violence or intimate partner violence. -- STD screening: gonorrhea/chlamydia NAAT not collected per patient request. -- pap smear collected per ASCCP guidelines  Incontinence: We will refer to urogynecology for further work-up.  Based on her description, she could have a component of stress urinary incontinence and possibly overflow urinary incontinence, with her long history of deferring voiding.  The urogynecologist could also address her recurrent urinary tract infection history, if she so desires.  Hemorrhoid: GI referral  Thomasene Mohair, MD 10/05/2021 3:43 PM

## 2021-10-06 ENCOUNTER — Encounter: Payer: Self-pay | Admitting: Obstetrics and Gynecology

## 2021-10-10 LAB — CYTOLOGY - PAP
Comment: NEGATIVE
Diagnosis: NEGATIVE
High risk HPV: NEGATIVE
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
## 2021-10-16 ENCOUNTER — Encounter: Payer: Self-pay | Admitting: Obstetrics and Gynecology

## 2021-10-16 ENCOUNTER — Ambulatory Visit (INDEPENDENT_AMBULATORY_CARE_PROVIDER_SITE_OTHER): Payer: Commercial Managed Care - PPO | Admitting: Obstetrics and Gynecology

## 2021-10-16 ENCOUNTER — Other Ambulatory Visit: Payer: Self-pay

## 2021-10-16 VITALS — BP 146/90 | HR 86 | Ht 68.0 in | Wt 219.0 lb

## 2021-10-16 DIAGNOSIS — N390 Urinary tract infection, site not specified: Secondary | ICD-10-CM

## 2021-10-16 DIAGNOSIS — N3941 Urge incontinence: Secondary | ICD-10-CM | POA: Diagnosis not present

## 2021-10-16 DIAGNOSIS — R350 Frequency of micturition: Secondary | ICD-10-CM | POA: Diagnosis not present

## 2021-10-16 DIAGNOSIS — N393 Stress incontinence (female) (male): Secondary | ICD-10-CM | POA: Diagnosis not present

## 2021-10-16 LAB — POCT URINALYSIS DIPSTICK
Appearance: ABNORMAL
Bilirubin, UA: NEGATIVE
Glucose, UA: NEGATIVE
Ketones, UA: NEGATIVE
Leukocytes, UA: NEGATIVE
Nitrite, UA: NEGATIVE
Protein, UA: POSITIVE — AB
Spec Grav, UA: >=1.03 — AB (ref 1.010–1.025)
Urobilinogen, UA: 0.2 E.U./dL
pH, UA: 5.5 (ref 5.0–8.0)

## 2021-10-16 NOTE — Patient Instructions (Signed)
For treatment of recurrent urinary tract infections, you can use D-mannose, and cranberry supplements over the counter.   Get urine cultures each time you have symptoms.   A referral has been placed to pelvic floor PT.

## 2021-11-01 ENCOUNTER — Ambulatory Visit: Payer: Commercial Managed Care - PPO | Admitting: Physical Therapy

## 2021-11-08 ENCOUNTER — Ambulatory Visit: Payer: Commercial Managed Care - PPO | Admitting: Physical Therapy

## 2021-11-15 ENCOUNTER — Ambulatory Visit: Payer: Commercial Managed Care - PPO | Admitting: Physical Therapy

## 2021-11-22 ENCOUNTER — Ambulatory Visit: Payer: Commercial Managed Care - PPO | Attending: Obstetrics and Gynecology | Admitting: Physical Therapy

## 2021-11-29 ENCOUNTER — Ambulatory Visit: Payer: Commercial Managed Care - PPO | Admitting: Physical Therapy

## 2021-12-06 ENCOUNTER — Encounter: Payer: Commercial Managed Care - PPO | Admitting: Physical Therapy

## 2021-12-13 ENCOUNTER — Encounter: Payer: Commercial Managed Care - PPO | Admitting: Physical Therapy

## 2021-12-20 ENCOUNTER — Encounter: Payer: Commercial Managed Care - PPO | Admitting: Physical Therapy

## 2022-01-18 ENCOUNTER — Ambulatory Visit: Payer: Commercial Managed Care - PPO | Admitting: Obstetrics and Gynecology

## 2022-01-23 ENCOUNTER — Ambulatory Visit: Payer: Commercial Managed Care - PPO | Admitting: Obstetrics and Gynecology

## 2022-09-16 ENCOUNTER — Encounter: Payer: Self-pay | Admitting: *Deleted

## 2024-04-27 ENCOUNTER — Encounter
Admission: RE | Disposition: A | Discharge status: Home / Self Care | Payer: Self-pay | Source: Home / Self Care | Attending: Surgery

## 2024-04-27 ENCOUNTER — Other Ambulatory Visit: Payer: Self-pay

## 2024-04-27 ENCOUNTER — Ambulatory Visit: Admitting: Anesthesiology

## 2024-04-27 ENCOUNTER — Ambulatory Visit: Payer: Self-pay | Admitting: Surgery

## 2024-04-27 ENCOUNTER — Encounter: Payer: Self-pay | Admitting: Surgery

## 2024-04-27 ENCOUNTER — Ambulatory Visit
Admission: RE | Admit: 2024-04-27 | Discharge: 2024-04-27 | Disposition: A | Discharge status: Home / Self Care | Attending: Surgery | Admitting: Surgery

## 2024-04-27 DIAGNOSIS — K645 Perianal venous thrombosis: Secondary | ICD-10-CM | POA: Insufficient documentation

## 2024-04-27 DIAGNOSIS — K219 Gastro-esophageal reflux disease without esophagitis: Secondary | ICD-10-CM | POA: Insufficient documentation

## 2024-04-27 DIAGNOSIS — K6282 Dysplasia of anus: Secondary | ICD-10-CM | POA: Diagnosis not present

## 2024-04-27 DIAGNOSIS — K641 Second degree hemorrhoids: Secondary | ICD-10-CM | POA: Insufficient documentation

## 2024-04-27 DIAGNOSIS — Z87891 Personal history of nicotine dependence: Secondary | ICD-10-CM | POA: Insufficient documentation

## 2024-04-27 DIAGNOSIS — Z860100 Personal history of colon polyps, unspecified: Secondary | ICD-10-CM | POA: Diagnosis not present

## 2024-04-27 DIAGNOSIS — L918 Other hypertrophic disorders of the skin: Secondary | ICD-10-CM | POA: Insufficient documentation

## 2024-04-27 DIAGNOSIS — Z01812 Encounter for preprocedural laboratory examination: Secondary | ICD-10-CM

## 2024-04-27 HISTORY — PX: RECTAL EXAM UNDER ANESTHESIA: SHX6399

## 2024-04-27 HISTORY — PX: HEMORRHOID SURGERY: SHX153

## 2024-04-27 LAB — POCT PREGNANCY, URINE: Preg Test, Ur: NEGATIVE

## 2024-04-27 SURGERY — HEMORRHOIDECTOMY
Anesthesia: General | Site: Rectum

## 2024-04-27 MED ORDER — FENTANYL CITRATE (PF) 100 MCG/2ML IJ SOLN
INTRAMUSCULAR | Status: DC | PRN
Start: 2024-04-27 — End: 2024-04-27
  Administered 2024-04-27: 50 ug via INTRAVENOUS

## 2024-04-27 MED ORDER — SEVOFLURANE IN SOLN
RESPIRATORY_TRACT | Status: AC
  Filled 2024-04-27: qty 250

## 2024-04-27 MED ORDER — OXYCODONE HCL 5 MG PO TABS
ORAL_TABLET | ORAL | Status: AC
  Filled 2024-04-27: qty 1

## 2024-04-27 MED ORDER — CHLORHEXIDINE GLUCONATE 0.12 % MT SOLN
15.0000 mL | Freq: Once | OROMUCOSAL | Status: AC
Start: 2024-04-27 — End: 2024-04-27

## 2024-04-27 MED ORDER — CHLORHEXIDINE GLUCONATE CLOTH 2 % EX PADS: 6.0000 | MEDICATED_PAD | Freq: Once | CUTANEOUS | Status: DC

## 2024-04-27 MED ORDER — BUPIVACAINE-EPINEPHRINE (PF) 0.5% -1:200000 IJ SOLN
INTRAMUSCULAR | Status: AC
  Filled 2024-04-27: qty 30

## 2024-04-27 MED ORDER — PROPOFOL 10 MG/ML IV BOLUS
INTRAVENOUS | Status: DC | PRN
  Administered 2024-04-27: 50 mg via INTRAVENOUS
  Administered 2024-04-27: 150 mg via INTRAVENOUS
  Administered 2024-04-27: 50 mg via INTRAVENOUS

## 2024-04-27 MED ORDER — CHLORHEXIDINE GLUCONATE 0.12 % MT SOLN
OROMUCOSAL | Status: AC
  Filled 2024-04-27: qty 15

## 2024-04-27 MED ORDER — OXYCODONE HCL 5 MG/5ML PO SOLN: 5.0000 mg | Freq: Once | ORAL | Status: AC | PRN

## 2024-04-27 MED ORDER — ACETAMINOPHEN 325 MG PO TABS: 650.0000 mg | ORAL_TABLET | Freq: Three times a day (TID) | ORAL | 0 refills | Status: AC | PRN

## 2024-04-27 MED ORDER — GELATIN ABSORBABLE 12-7 MM EX MISC
CUTANEOUS | Status: AC
  Filled 2024-04-27: qty 1

## 2024-04-27 MED ORDER — PROPOFOL 10 MG/ML IV BOLUS
INTRAVENOUS | Status: AC
  Filled 2024-04-27: qty 20

## 2024-04-27 MED ORDER — CEFAZOLIN SODIUM-DEXTROSE 2-4 GM/100ML-% IV SOLN
2.0000 g | INTRAVENOUS | Status: AC
  Administered 2024-04-27: 2 g via INTRAVENOUS

## 2024-04-27 MED ORDER — MIDAZOLAM HCL 2 MG/2ML IJ SOLN
INTRAMUSCULAR | Status: AC
  Filled 2024-04-27: qty 2

## 2024-04-27 MED ORDER — GELATIN ABSORBABLE 12-7 MM EX MISC
CUTANEOUS | Status: DC | PRN
Start: 2024-04-27 — End: 2024-04-27
  Administered 2024-04-27: 1

## 2024-04-27 MED ORDER — OXYCODONE HCL 5 MG PO TABS
5.0000 mg | ORAL_TABLET | Freq: Once | ORAL | Status: AC | PRN
  Administered 2024-04-27: 5 mg via ORAL

## 2024-04-27 MED ORDER — ONDANSETRON HCL 4 MG/2ML IJ SOLN
INTRAMUSCULAR | Status: DC | PRN
  Administered 2024-04-27: 4 mg via INTRAVENOUS

## 2024-04-27 MED ORDER — MIDAZOLAM HCL 2 MG/2ML IJ SOLN
INTRAMUSCULAR | Status: DC | PRN
  Administered 2024-04-27: 2 mg via INTRAVENOUS

## 2024-04-27 MED ORDER — LACTATED RINGERS IV SOLN: INTRAVENOUS | Status: DC

## 2024-04-27 MED ORDER — LIDOCAINE HCL (CARDIAC) PF 100 MG/5ML IV SOSY
PREFILLED_SYRINGE | INTRAVENOUS | Status: DC | PRN
  Administered 2024-04-27: 100 mg via INTRAVENOUS

## 2024-04-27 MED ORDER — DEXAMETHASONE SODIUM PHOSPHATE 10 MG/ML IJ SOLN
INTRAMUSCULAR | Status: AC
  Filled 2024-04-27: qty 1

## 2024-04-27 MED ORDER — LIDOCAINE HCL (PF) 2 % IJ SOLN
INTRAMUSCULAR | Status: AC
  Filled 2024-04-27: qty 5

## 2024-04-27 MED ORDER — ORAL CARE MOUTH RINSE
15.0000 mL | Freq: Once | OROMUCOSAL | Status: AC
Start: 2024-04-27 — End: 2024-04-27
  Administered 2024-04-27: 15 mL via OROMUCOSAL

## 2024-04-27 MED ORDER — BUPIVACAINE LIPOSOME 1.3 % IJ SUSP
INTRAMUSCULAR | Status: AC
  Filled 2024-04-27: qty 20

## 2024-04-27 MED ORDER — DOCUSATE SODIUM 100 MG PO CAPS: 100.0000 mg | ORAL_CAPSULE | Freq: Two times a day (BID) | ORAL | 0 refills | Status: AC | PRN

## 2024-04-27 MED ORDER — BUPIVACAINE LIPOSOME 1.3 % IJ SUSP
INTRAMUSCULAR | Status: DC | PRN
Start: 2024-04-27 — End: 2024-04-27
  Administered 2024-04-27: 20 mL

## 2024-04-27 MED ORDER — IBUPROFEN 800 MG PO TABS: 800.0000 mg | ORAL_TABLET | Freq: Three times a day (TID) | ORAL | 0 refills | Status: AC | PRN

## 2024-04-27 MED ORDER — ONDANSETRON HCL 4 MG/2ML IJ SOLN: 4.0000 mg | Freq: Once | INTRAMUSCULAR | Status: DC | PRN

## 2024-04-27 MED ORDER — CEFAZOLIN SODIUM-DEXTROSE 2-4 GM/100ML-% IV SOLN
INTRAVENOUS | Status: AC
  Filled 2024-04-27: qty 100

## 2024-04-27 MED ORDER — FENTANYL CITRATE (PF) 100 MCG/2ML IJ SOLN
INTRAMUSCULAR | Status: AC
  Filled 2024-04-27: qty 2

## 2024-04-27 MED ORDER — FENTANYL CITRATE (PF) 100 MCG/2ML IJ SOLN
25.0000 ug | INTRAMUSCULAR | Status: DC | PRN
  Administered 2024-04-27 (×2): 25 ug via INTRAVENOUS

## 2024-04-27 MED ORDER — HYDROCODONE-ACETAMINOPHEN 5-325 MG PO TABS: 1.0000 | ORAL_TABLET | Freq: Three times a day (TID) | ORAL | 0 refills | Status: AC | PRN

## 2024-04-27 MED ORDER — BUPIVACAINE-EPINEPHRINE (PF) 0.5% -1:200000 IJ SOLN
INTRAMUSCULAR | Status: DC | PRN
  Administered 2024-04-27: 20 mL via PERINEURAL
  Administered 2024-04-27: 10 mL via PERINEURAL

## 2024-04-27 MED ORDER — DEXAMETHASONE SODIUM PHOSPHATE 10 MG/ML IJ SOLN
INTRAMUSCULAR | Status: DC | PRN
  Administered 2024-04-27: 10 mg via INTRAVENOUS

## 2024-04-27 MED ORDER — ACETAMINOPHEN 10 MG/ML IV SOLN: 1000.0000 mg | Freq: Once | INTRAVENOUS | Status: DC | PRN

## 2024-04-27 MED ORDER — 0.9 % SODIUM CHLORIDE (POUR BTL) OPTIME
TOPICAL | Status: DC | PRN
  Administered 2024-04-27: 500 mL

## 2024-04-27 MED ORDER — ONDANSETRON HCL 4 MG/2ML IJ SOLN
INTRAMUSCULAR | Status: AC
  Filled 2024-04-27: qty 2

## 2024-04-27 SURGICAL SUPPLY — 32 items
BLADE SURG 15 STRL LF DISP TIS (BLADE) ×2 IMPLANT
BRIEF MESH DISP 2XL (UNDERPADS AND DIAPERS) ×2 IMPLANT
DRAPE PERI LITHO V/GYN (MISCELLANEOUS) ×2 IMPLANT
DRAPE UNDER BUTTOCK W/FLU (DRAPES) ×2 IMPLANT
DRSG GAUZE FLUFF 36X18 (GAUZE/BANDAGES/DRESSINGS) ×2 IMPLANT
ELECTRODE REM PT RTRN 9FT ADLT (ELECTROSURGICAL) ×2 IMPLANT
GAUZE 4X4 16PLY ~~LOC~~+RFID DBL (SPONGE) IMPLANT
GAUZE SPONGE 4X4 12PLY STRL (GAUZE/BANDAGES/DRESSINGS) IMPLANT
GLOVE BIOGEL PI IND STRL 7.0 (GLOVE) ×2 IMPLANT
GLOVE SURG SYN 6.5 ES PF (GLOVE) ×6 IMPLANT
GLOVE SURG SYN 6.5 PF PI (GLOVE) ×6 IMPLANT
GOWN STRL REUS W/ TWL LRG LVL3 (GOWN DISPOSABLE) ×6 IMPLANT
KIT TURNOVER CYSTO (KITS) ×2 IMPLANT
LABEL OR SOLS (LABEL) ×2 IMPLANT
MANIFOLD NEPTUNE II (INSTRUMENTS) ×2 IMPLANT
NDL HYPO 22X1.5 SAFETY MO (MISCELLANEOUS) ×2 IMPLANT
NDL SAFETY ECLIPSE 18X1.5 (NEEDLE) ×2 IMPLANT
NEEDLE HYPO 22X1.5 SAFETY MO (MISCELLANEOUS) ×2 IMPLANT
NS IRRIG 500ML POUR BTL (IV SOLUTION) ×2 IMPLANT
PACK BASIN MINOR ARMC (MISCELLANEOUS) ×2 IMPLANT
PAD PREP OB/GYN DISP 24X41 (PERSONAL CARE ITEMS) ×2 IMPLANT
SHEARS HARMONIC 9CM CVD (BLADE) IMPLANT
SOLUTION PREP PVP 2OZ (MISCELLANEOUS) ×2 IMPLANT
SURGILUBE 2OZ TUBE FLIPTOP (MISCELLANEOUS) ×2 IMPLANT
SUT SILK 0 SH 30 (SUTURE) IMPLANT
SUT VIC AB 3-0 SH 27X BRD (SUTURE) ×2 IMPLANT
SWAB CULTURE AMIES ANAERIB BLU (MISCELLANEOUS) IMPLANT
SYR 10ML LL (SYRINGE) ×4 IMPLANT
SYR 20ML LL LF (SYRINGE) ×2 IMPLANT
TOWEL OR 17X26 4PK STRL BLUE (TOWEL DISPOSABLE) IMPLANT
TRAP FLUID SMOKE EVACUATOR (MISCELLANEOUS) ×2 IMPLANT
WATER STERILE IRR 500ML POUR (IV SOLUTION) ×2 IMPLANT

## 2024-04-27 NOTE — Anesthesia Preprocedure Evaluation (Signed)
 Anesthesia Evaluation  Patient identified by MRN, date of birth, ID band Patient awake    Reviewed: Allergy & Precautions, NPO status , Patient's Chart, lab work & pertinent test results  History of Anesthesia Complications Negative for: history of anesthetic complications  Airway Mallampati: II  TM Distance: >3 FB Neck ROM: Full    Dental no notable dental hx. (+) Teeth Intact   Pulmonary neg pulmonary ROS, neg sleep apnea, neg COPD, Patient abstained from smoking.Not current smoker   Pulmonary exam normal breath sounds clear to auscultation       Cardiovascular Exercise Tolerance: Good METS(-) hypertension(-) CAD and (-) Past MI negative cardio ROS (-) dysrhythmias  Rhythm:Regular Rate:Normal - Systolic murmurs    Neuro/Psych  Headaches  negative psych ROS   GI/Hepatic ,GERD  Controlled,,(+)     substance abuse  marijuana useNo MJ use in a few weeks   Endo/Other  neg diabetes    Renal/GU negative Renal ROS     Musculoskeletal   Abdominal  (+) + obese  Peds  Hematology   Anesthesia Other Findings Past Medical History: No date: IBS (irritable bowel syndrome) No date: Migraines  Reproductive/Obstetrics                             Anesthesia Physical Anesthesia Plan  ASA: 2  Anesthesia Plan: General   Post-op Pain Management: Ofirmev IV (intra-op)* and Toradol IV (intra-op)*   Induction: Intravenous  PONV Risk Score and Plan: 3 and Ondansetron, Dexamethasone and Midazolam  Airway Management Planned: LMA  Additional Equipment: None  Intra-op Plan:   Post-operative Plan: Extubation in OR  Informed Consent: I have reviewed the patients History and Physical, chart, labs and discussed the procedure including the risks, benefits and alternatives for the proposed anesthesia with the patient or authorized representative who has indicated his/her understanding and acceptance.      Dental advisory given  Plan Discussed with: CRNA and Surgeon  Anesthesia Plan Comments: (Discussed risks of anesthesia with patient, including PONV, sore throat, lip/dental/eye damage. Rare risks discussed as well, such as cardiorespiratory and neurological sequelae, and allergic reactions. Discussed the role of CRNA in patient's perioperative care. Patient understands.)       Anesthesia Quick Evaluation

## 2024-04-27 NOTE — Op Note (Signed)
 Preoperative diagnosis: Second degree hemorrhoids and skin tags  Postoperative diagnosis: Second-degree hemorrhoids and perianal thrombosis, skin tags  Procedure: exam under anesthesia, 2 column hemorrhoidectomy.  Surgeon: Rosea Conch  Anesthesia: general  Specimen: hemorrhoids and skin tag  Complications: none  EBL: 15mL  Wound classification: Clean Contaminated  Indications: Patient is a 43 y.o. female was found to have symptomatic hemorrhoids refractory to medical management.   Findings: 1.  Second degree hemorrhoids with associated perianal venous thrombosis at right posterior aspect 2.  Skin tag noted left anterior aspect   3. Adequate hemostasis  Description of procedure: The patient was brought to the operating room and general anesthesia was induced. Patient was placed high lithotomy position. A time-out was completed verifying correct patient, procedure, site, positioning, and implant(s) and/or special equipment prior to beginning this procedure. The perineum was prepped and draped in standard sterile fashion. Local anesthetic was injected as a perianal block. An anoscope was introduced and hemorrhoidal pedicles identified, with associated perianal thrombosis in the right posterior pedicle.  Decision made to proceed with hemorrhoidectomy and skin tag removal for symptom relief.  No other obvious pathology such as fissures noted.  A harmonic was placed across the base of the right posterior pedicle and excess hemorrhoidal tissue removed.  Specimen was passed off operative field pending pathology.  3-0 Vicryl then used to close the open wound.  A harmonic was placed across the base of the left anterior skin tag and removed.  Specimen was passed off operative field pending pathology.  3-0 Vicryl then used to close the open wound.  Last inspection of the anal canal did not note any additional pathologic hemorrhoidal tissue and no other pathology. Exparel injected as a perianal block. A  gauze pad was tucked between the gluteal folds, and secured in place with mesh underwear.  The patient tolerated the procedure well and was taken to the postanesthesia care unit in stable condition.  Sponge and instrument count correct at end of procedure.

## 2024-04-27 NOTE — H&P (Signed)
 Subjective:  CC: Grade II hemorrhoids [K64.1]   HPI:  Latasha Graham is a 43 y.o. female who was referrred by Aviva Lemmings, NP for above. Symptoms were first noted a few days ago. Worsening pain and swelling in area after lifting.  Last colonoscopy showed polyps, benign.  Past Medical History:  has a past medical history of Abnormal uterine bleeding, Anxiety, Chronic tension headaches, History of pyelonephritis, Insomnia, Irritable bowel syndrome, Kidney stones, Migraines, Nephritis, and Tobacco use.  Past Surgical History:  has a past surgical history that includes Kidney stone removal (1994); Cervical biopsy w/ loop electrode excision; Colonoscopy (01/06/2019); egd (01/07/2019); and egd (02/09/2019).  Family History: family history includes Coronary Artery Disease (Blocked arteries around heart) in an other family member; Diabetes in an other family member; Hip fracture in an other family member.  Social History:  reports that she has quit smoking. She has never used smokeless tobacco. She reports current alcohol use. She reports that she does not use drugs.  Current Medications: has a current medication list which includes the following prescription(s): albuterol mdi (proventil, ventolin, proair) hfa, alprazolam, cyanocobalamin , cyclobenzaprine, eletriptan, gabapentin, pramipexole, propranolol, syringe with needle, temazepam, topiramate, trazodone, venlafaxine, ferrous gluconate, and nystatin.  Allergies:  Allergies as of 04/26/2024 - Reviewed 04/26/2024  Allergen Reaction Noted   Erythromycin Other (See Comments) 07/08/2014    ROS:  A 15 point review of systems was performed and pertinent positives and negatives noted in HPI  Objective:     BP 107/78   Pulse 71   Ht 170.2 cm (5\' 7" )   Wt 100.7 kg (222 lb)   LMP 03/24/2024 (Approximate)   BMI 34.77 kg/m   Constitutional :  No distress, cooperative, alert  Lymphatics/Throat:  Supple with no lymphadenopathy   Respiratory:  Clear to auscultation bilaterally  Cardiovascular:  Regular rate and rhythm  Gastrointestinal: Soft, non-tender, non-distended, no organomegaly.  Musculoskeletal: Steady gait and movement  Skin: Cool and moist  Psychiatric: Normal affect, non-agitated, not confused  Rectal: Chaperone present for exam.  Small skin tag on external exam, with profound rectal TTP at anal verge, but no evidence of pathology such as thrombosed hemorrhoids, induration or drainage to indicate abscess.  No point TTP in posterior midline to indicate anal fissure. DRE deferred due to discomfort      LABS:  N/a   RADS: N/a  Assessment:     Grade II hemorrhoids [K64.1] - possible based on history and skin tags around area, but atypical degree of pain experienced on exam, so recommended EUA for further evaluation  Plan:    1. Grade II hemorrhoids [K64.1] Discussed risks/benefits/alternatives to surgery.  Alternatives include the options of observation, medical management.  Benefits include symptomatic relief.  I discussed  in detail and the complications related to the operation and the anesthesia, including bleeding, infection, recurrence, remote possibility of temporary or permanent fecal incontinence, poor/delayed wound healing, chronic pain, and additional procedures to address said risks. The risks of general anesthetic, if used, includes MI, CVA, sudden death or even reaction to anesthetic medications also discussed.   We also discussed typical post operative recovery which includes weeks to potentially months of anal pain, drainage, occasional bleeding, and sense of fecal urgency.    ED return precautions given for sudden increase in pain, bleeding, with possible accompanying fever, nausea, and/or vomiting.  The patient understands the risks, any and all questions were answered to the patient's satisfaction.  2. Patient has elected to proceed with surgical treatment. Procedure  will be  scheduled.  labs/images/medications/previous chart entries reviewed personally and relevant changes/updates noted above.

## 2024-04-27 NOTE — Discharge Instructions (Addendum)
 Hemorrhoids, Care After This sheet gives you information about how to care for yourself after your procedure. Your health care provider may also give you more specific instructions. If you have problems or questions, contact your health care provider. What can I expect after the procedure? After the procedure, it is common to have: Soreness. Bruising. Itching.  Follow these instructions at home: site care Follow instructions from your health care provider about how to take care of your site. Make sure you: LEAVE packing in place until it falls out on its own.  No need to replace afterwards Leave stitches (sutures), skin glue, or adhesive strips in place.  If the area bleeds or bruises, apply gentle pressure for 10 minutes. OK TO SHOWER IN 24HRS  General instructions Rest and then return to your normal activities as told by your health care provider. tylenol and advil as needed for discomfort.  Please alternate between the two every four hours as needed for pain.    Use narcotics, if prescribed, only when tylenol and motrin is not enough to control pain.  325-650mg  every 8hrs to max of 3000mg /24hrs (including the 325mg  in every norco dose) for the tylenol.    Advil up to 800mg  per dose every 8hrs as needed for pain.   Keep all follow-up visits as told by your health care provider. This is important. Contact a health care provider if you have excessive: redness, swelling, or pain around your site. blood coming from your site. pus or a bad smell coming from your site. You have a fever.  Get help right away if: You have bleeding that does not stop with pressure or a dressing. Summary After the procedure, it is common to have some soreness, bruising, and itching at the site. Follow instructions from your health care provider about how to take care of your site. Keep all follow-up visits as told by your health care provider. This is important. This information is not intended to replace  advice given to you by your health care provider. Make sure you discuss any questions you have with your health care provider. Document Released: 12/08/2015 Document Revised: 05/11/2018 Document Reviewed: 05/11/2018 Elsevier Interactive Patient Education  2019 [employer].  Information for Discharge Teaching:  Do Not remove TEAL EXPAREL BRACELET FOR 4 days, 96 hours, 03/30/2024. EXPAREL (bupivacaine liposome injectable suspension)   Pain relief is important to your recovery. The goal is to control your pain so you can move easier and return to your normal activities as soon as possible after your procedure. Your physician may use several types of medicines to manage pain, swelling, and more.  Your surgeon or anesthesiologist gave you EXPAREL(bupivacaine) to help control your pain after surgery.  EXPAREL is a local anesthetic designed to release slowly over an extended period of time to provide pain relief by numbing the tissue around the surgical site. EXPAREL is designed to release pain medication over time and can control pain for up to 72 hours. Depending on how you respond to EXPAREL, you may require less pain medication during your recovery. EXPAREL can help reduce or eliminate the need for opioids during the first few days after surgery when pain relief is needed the most. EXPAREL is not an opioid and is not addictive. It does not cause sleepiness or sedation.   Important! A teal colored band has been placed on your arm with the date, time and amount of EXPAREL you have received. Please leave this armband in place for the full 96  hours following administration, and then you may remove the band. If you return to the hospital for any reason within 96 hours following the administration of EXPAREL, the armband provides important information that your health care providers to know, and alerts them that you have received this anesthetic.    Possible side effects of EXPAREL: Temporary loss of  sensation or ability to move in the area where medication was injected. Nausea, vomiting, constipation Rarely, numbness and tingling in your mouth or lips, lightheadedness, or anxiety may occur. Call your doctor right away if you think you may be experiencing any of these sensations, or if you have other questions regarding possible side effects.  Follow all other discharge instructions given to you by your surgeon or nurse. Eat a healthy diet and drink plenty of water or other fluids.

## 2024-04-27 NOTE — Anesthesia Procedure Notes (Signed)
 Procedure Name: LMA Insertion Date/Time: 04/27/2024 12:08 PM  Performed by: Racheal Buddle, CRNAPre-anesthesia Checklist: Patient identified, Patient being monitored, Timeout performed, Emergency Drugs available and Suction available Patient Re-evaluated:Patient Re-evaluated prior to induction Oxygen Delivery Method: Circle system utilized Preoxygenation: Pre-oxygenation with 100% oxygen Induction Type: IV induction Ventilation: Mask ventilation without difficulty LMA: LMA inserted LMA Size: 4.0 Tube type: Oral Number of attempts: 1 Placement Confirmation: positive ETCO2 and breath sounds checked- equal and bilateral Tube secured with: Tape Dental Injury: Teeth and Oropharynx as per pre-operative assessment

## 2024-04-27 NOTE — H&P (View-Only) (Signed)
 Subjective:  CC: Grade II hemorrhoids [K64.1]   HPI:  Latasha Graham is a 43 y.o. female who was referrred by Aviva Lemmings, NP for above. Symptoms were first noted a few days ago. Worsening pain and swelling in area after lifting.  Last colonoscopy showed polyps, benign.  Past Medical History:  has a past medical history of Abnormal uterine bleeding, Anxiety, Chronic tension headaches, History of pyelonephritis, Insomnia, Irritable bowel syndrome, Kidney stones, Migraines, Nephritis, and Tobacco use.  Past Surgical History:  has a past surgical history that includes Kidney stone removal (1994); Cervical biopsy w/ loop electrode excision; Colonoscopy (01/06/2019); egd (01/07/2019); and egd (02/09/2019).  Family History: family history includes Coronary Artery Disease (Blocked arteries around heart) in an other family member; Diabetes in an other family member; Hip fracture in an other family member.  Social History:  reports that she has quit smoking. She has never used smokeless tobacco. She reports current alcohol use. She reports that she does not use drugs.  Current Medications: has a current medication list which includes the following prescription(s): albuterol mdi (proventil, ventolin, proair) hfa, alprazolam, cyanocobalamin , cyclobenzaprine, eletriptan, gabapentin, pramipexole, propranolol, syringe with needle, temazepam, topiramate, trazodone, venlafaxine, ferrous gluconate, and nystatin.  Allergies:  Allergies as of 04/26/2024 - Reviewed 04/26/2024  Allergen Reaction Noted   Erythromycin Other (See Comments) 07/08/2014    ROS:  A 15 point review of systems was performed and pertinent positives and negatives noted in HPI  Objective:     BP 107/78   Pulse 71   Ht 170.2 cm (5\' 7" )   Wt 100.7 kg (222 lb)   LMP 03/24/2024 (Approximate)   BMI 34.77 kg/m   Constitutional :  No distress, cooperative, alert  Lymphatics/Throat:  Supple with no lymphadenopathy   Respiratory:  Clear to auscultation bilaterally  Cardiovascular:  Regular rate and rhythm  Gastrointestinal: Soft, non-tender, non-distended, no organomegaly.  Musculoskeletal: Steady gait and movement  Skin: Cool and moist  Psychiatric: Normal affect, non-agitated, not confused  Rectal: Chaperone present for exam.  Small skin tag on external exam, with profound rectal TTP at anal verge, but no evidence of pathology such as thrombosed hemorrhoids, induration or drainage to indicate abscess.  No point TTP in posterior midline to indicate anal fissure. DRE deferred due to discomfort      LABS:  N/a   RADS: N/a  Assessment:     Grade II hemorrhoids [K64.1] - possible based on history and skin tags around area, but atypical degree of pain experienced on exam, so recommended EUA for further evaluation  Plan:    1. Grade II hemorrhoids [K64.1] Discussed risks/benefits/alternatives to surgery.  Alternatives include the options of observation, medical management.  Benefits include symptomatic relief.  I discussed  in detail and the complications related to the operation and the anesthesia, including bleeding, infection, recurrence, remote possibility of temporary or permanent fecal incontinence, poor/delayed wound healing, chronic pain, and additional procedures to address said risks. The risks of general anesthetic, if used, includes MI, CVA, sudden death or even reaction to anesthetic medications also discussed.   We also discussed typical post operative recovery which includes weeks to potentially months of anal pain, drainage, occasional bleeding, and sense of fecal urgency.    ED return precautions given for sudden increase in pain, bleeding, with possible accompanying fever, nausea, and/or vomiting.  The patient understands the risks, any and all questions were answered to the patient's satisfaction.  2. Patient has elected to proceed with surgical treatment. Procedure  will be  scheduled.  labs/images/medications/previous chart entries reviewed personally and relevant changes/updates noted above.

## 2024-04-27 NOTE — Interval H&P Note (Signed)
 No change. OK to proceed.

## 2024-04-27 NOTE — Anesthesia Postprocedure Evaluation (Signed)
 Anesthesia Post Note  Patient: Latasha Graham  Procedure(s) Performed: HEMORRHOIDECTOMY (Rectum) EXAM UNDER ANESTHESIA, RECTUM  Patient location during evaluation: PACU Anesthesia Type: General Level of consciousness: awake and alert Pain management: pain level controlled Vital Signs Assessment: post-procedure vital signs reviewed and stable Respiratory status: spontaneous breathing, nonlabored ventilation, respiratory function stable and patient connected to nasal cannula oxygen Cardiovascular status: blood pressure returned to baseline and stable Postop Assessment: no apparent nausea or vomiting Anesthetic complications: no   No notable events documented.   Last Vitals:  Vitals:   04/27/24 1400 04/27/24 1415  BP: 104/70 107/69  Pulse: (!) 57 (!) 57  Resp: 13 17  Temp:    SpO2: 96% 94%    Last Pain:  Vitals:   04/27/24 1428  TempSrc:   PainSc: 6                  Lattie Poli

## 2024-04-27 NOTE — Transfer of Care (Signed)
 Immediate Anesthesia Transfer of Care Note  Patient: Latasha Graham  Procedure(s) Performed: HEMORRHOIDECTOMY (Rectum) EXAM UNDER ANESTHESIA, RECTUM  Patient Location: PACU  Anesthesia Type:General  Level of Consciousness: drowsy  Airway & Oxygen Therapy: Patient Spontanous Breathing and Patient connected to nasal cannula oxygen  Post-op Assessment: Report given to RN and Post -op Vital signs reviewed and stable  Post vital signs: stable  Last Vitals:  Vitals Value Taken Time  BP 107/71 04/27/24 1254  Temp    Pulse 60 04/27/24 1258  Resp 12 04/27/24 1258  SpO2 94 % 04/27/24 1258  Vitals shown include unfiled device data.  Last Pain:  Vitals:   04/27/24 1106  TempSrc: Temporal  PainSc: 8          Complications: No notable events documented.

## 2024-04-28 ENCOUNTER — Encounter: Payer: Self-pay | Admitting: Surgery

## 2024-04-28 LAB — SURGICAL PATHOLOGY

## 2024-05-24 ENCOUNTER — Other Ambulatory Visit: Payer: Self-pay | Admitting: Surgery

## 2024-05-24 DIAGNOSIS — K6289 Other specified diseases of anus and rectum: Secondary | ICD-10-CM

## 2024-05-24 NOTE — Progress Notes (Signed)
 Subjective:   CC: Grade II hemorrhoids [K64.1] POSTOP  HPI:  Latasha Graham is a 43 y.o. female who is here for followup from above. 8/10 pain now, worse with sitting.  Laying down better.  Only tylenol  and motrin  now.  Occasional drainage and blood, but improving.  Senna helps with 1-2 BM/day, still sore.   Current Medications: has a current medication list which includes the following prescription(s): acetaminophen , albuterol mdi (proventil, ventolin, proair) hfa, alprazolam, cyanocobalamin , cyclobenzaprine, eletriptan, famotidine, gabapentin, lidocaine , pramipexole, propranolol, syringe with needle, temazepam, topiramate, trazodone, venlafaxine, and gabapentin.  Allergies:  Allergies  Allergen Reactions  . Erythromycin Other (See Comments)    GI upset      ROS: General: Denies weight loss, weight gain, fatigue, fevers, chills, and night sweats. Heart: Denies chest pain, palpitations, racing heart, irregular heartbeat, leg pain or swelling, and decreased activity tolerance. Respiratory: Denies breathing difficulty, shortness of breath, wheezing, cough, and sputum. GI: Denies change in appetite, heartburn, nausea, vomiting, constipation, diarrhea, and blood in stool. GU: Denies difficulty urinating, pain with urinating, urgency, frequency, blood in urine    Objective:     BP 109/82   Pulse 71   Ht 172.7 cm (5' 8)   Wt 100.7 kg (222 lb)   LMP 04/23/2024   BMI 33.75 kg/m   Constitutional :  Alert, no distress, cooperative  Gastrointestinal: soft, non-tender; bowel sounds normal; no masses,  no organomegaly.   Musculoskeletal: Steady gait and movement  Skin: Cool and moist  Psychiatric: Normal affect, non-agitated, not confused  Rectal: Chaperone present for exam, external exam within normal limits.  Area of excision with more pronounced soreness when trying to examine deeper, but unable to fully visualize due to discomfort.  No palpable induration or swelling. Good sphincter  tone.    LABS:  SURGICAL PATHOLOGY SURGICAL PATHOLOGY [facility] [address], [address] [place], [place] [address] Telephone [phone] or [phone] Fax [phone]  REPORT OF SURGICAL PATHOLOGY   Accession #: [id number] Patient Name: Latasha Graham Visit # : [id number]  MRN: [id number] Physician: Tye Millet DOB/Age July 30, 1981 (Age: 37) Gender: F Collected Date: 04/27/2024 Received Date: 04/27/2024  FINAL DIAGNOSIS       1. Hemorrhoids,  :      - HEMORRHOID WITH FOCAL LOW-GRADE SQUAMOUS INTRAEPITHELIAL LESION (LSIL /      AIN-1).       Diagnosis Note : A p16 stain was performed and supports the above diagnosis.      Stain controls worked appropriately.      DATE SIGNED OUT: 04/28/2024 ELECTRONIC SIGNATURE : Janel Md, Tara , Pathologist, Electronic Signature  MICROSCOPIC DESCRIPTION  CASE COMMENTS STAINS USED IN DIAGNOSIS: H&E Universal Negative Control-DAB Stains used in diagnosis 1 P16 Some of these immunohistochemical stains may have been developed and the performance characteristics determined by [facility].  Some may not have been cleared or approved by the U.S. Food and Drug Administration.  The FDA has determined that such clearance or approval is not necessary.  This test is used for clinical purposes.  It should not be regarded as investigational or for research.  This laboratory is certified under the Clinical Laboratory Improvement Amendments of 1988 (CLIA-88) as qualified to perform high complexity clinical laboratory testing.    CLINICAL HISTORY  SPECIMEN(S) OBTAINED 1. Hemorrhoids,  SPECIMEN COMMENTS: SPECIMEN CLINICAL INFORMATION:    Gross Description 1. Hemorrhoids, received fresh and placed in formalin are 2 unoriented and undesignated, mildly wrinkled, glistening portions of  soft, rubbery, pink-gray tissue that are 1.8 x 1.4 x 0.5 cm (inked blue) and 1.5 x 0.8 x 0.5 cm (inked orange).  Each fragment has a cauterized margin and mildly hemorrhagic cut surfaces that are otherwise pink-tan, soft, and homogenous. Representative sections are submitted in 1 block (1A).      [facility] 04/27/2024        Report signed out from the following location(s) [facility] [address] [place], [place] [address] CLIA #: [id number]  [facility] [address] [place], [place] [address] CLIA #: [id number]    RADS: N/A  Assessment:      Grade II hemorrhoids [K64.1] S/p excision  Incidental LSIL/AIN-1 as noted in path report.  Pt states she had LEEP in past for atypical cells of cervix.  Plan:     1. Physical exam as expected. overall recovering as expected at this point.  Discharge and bleeding hopefully will decrease and stop in next week or oso.  Able to stay off narcotics for over a week now, which is also reassuring despite reported pain level.   Continue tylenol , advil , and gabapentin.  Refill on oxycodone  placed since still endorses pain with non-narcotic management.  followup with colorectal surgery.  No need to followup with our office after the colorectal appointment.  schedule previously ordered CT scan if pain worsens or seems to not improve in the next couple weeks.  labs/images/medications/previous chart entries reviewed personally and relevant changes/updates noted above.

## 2024-05-25 NOTE — Progress Notes (Signed)
 Switched ER oxy to IR oxy due to insurance

## 2024-06-11 ENCOUNTER — Other Ambulatory Visit: Payer: Self-pay | Admitting: Surgery

## 2024-06-11 DIAGNOSIS — K610 Anal abscess: Secondary | ICD-10-CM

## 2024-06-11 DIAGNOSIS — K641 Second degree hemorrhoids: Secondary | ICD-10-CM

## 2024-06-18 ENCOUNTER — Ambulatory Visit
Admission: RE | Admit: 2024-06-18 | Discharge: 2024-06-18 | Disposition: A | Discharge status: Home / Self Care | Source: Ambulatory Visit | Attending: Surgery | Admitting: Surgery

## 2024-06-18 DIAGNOSIS — K641 Second degree hemorrhoids: Secondary | ICD-10-CM | POA: Insufficient documentation

## 2024-06-18 DIAGNOSIS — K610 Anal abscess: Secondary | ICD-10-CM | POA: Insufficient documentation

## 2024-06-18 MED ORDER — IOHEXOL 300 MG/ML  SOLN
100.0000 mL | Freq: Once | INTRAMUSCULAR | Status: AC | PRN
  Administered 2024-06-18: 100 mL via INTRAVENOUS
